# Patient Record
Sex: Female | Born: 1985 | Hispanic: Yes | Marital: Single | Smoking: Never smoker
Health system: Southern US, Community
[De-identification: ages and names within clinical notes are randomized; demographics above are authoritative.]

## PROBLEM LIST (undated history)

## (undated) DIAGNOSIS — O34599 Maternal care for other abnormalities of gravid uterus, unspecified trimester: Secondary | ICD-10-CM

## (undated) DIAGNOSIS — Z789 Other specified health status: Secondary | ICD-10-CM

## (undated) HISTORY — PX: BREAST CYST EXCISION: SHX579

---

## 2018-04-22 LAB — OB RESULTS CONSOLE RUBELLA ANTIBODY, IGM: Rubella: IMMUNE

## 2018-04-22 LAB — OB RESULTS CONSOLE ANTIBODY SCREEN: Antibody Screen: NEGATIVE

## 2018-04-22 LAB — OB RESULTS CONSOLE RPR: RPR: NONREACTIVE

## 2018-04-22 LAB — OB RESULTS CONSOLE ABO/RH: RH Type: POSITIVE

## 2018-04-22 LAB — OB RESULTS CONSOLE GC/CHLAMYDIA
Chlamydia: NEGATIVE
GC PROBE AMP, GENITAL: NEGATIVE

## 2018-04-22 LAB — OB RESULTS CONSOLE HIV ANTIBODY (ROUTINE TESTING): HIV: NONREACTIVE

## 2018-04-22 LAB — OB RESULTS CONSOLE HEPATITIS B SURFACE ANTIGEN: Hepatitis B Surface Ag: NEGATIVE

## 2018-06-10 ENCOUNTER — Encounter (HOSPITAL_COMMUNITY): Payer: Self-pay

## 2018-06-10 ENCOUNTER — Other Ambulatory Visit (HOSPITAL_COMMUNITY): Payer: Self-pay | Admitting: Obstetrics and Gynecology

## 2018-06-10 DIAGNOSIS — O289 Unspecified abnormal findings on antenatal screening of mother: Secondary | ICD-10-CM

## 2018-06-12 ENCOUNTER — Encounter (HOSPITAL_COMMUNITY): Payer: Self-pay | Admitting: *Deleted

## 2018-06-16 ENCOUNTER — Encounter (HOSPITAL_COMMUNITY): Payer: Self-pay

## 2018-06-16 ENCOUNTER — Ambulatory Visit (HOSPITAL_COMMUNITY)
Admission: RE | Admit: 2018-06-16 | Discharge: 2018-06-16 | Disposition: A | Discharge status: Home / Self Care | Payer: BC Managed Care – PPO | Source: Ambulatory Visit | Attending: Obstetrics and Gynecology | Admitting: Obstetrics and Gynecology

## 2018-06-16 ENCOUNTER — Other Ambulatory Visit (HOSPITAL_COMMUNITY): Payer: Self-pay | Admitting: *Deleted

## 2018-06-16 DIAGNOSIS — Z3A18 18 weeks gestation of pregnancy: Secondary | ICD-10-CM

## 2018-06-16 DIAGNOSIS — Q513 Bicornate uterus: Secondary | ICD-10-CM | POA: Insufficient documentation

## 2018-06-16 DIAGNOSIS — O4592 Premature separation of placenta, unspecified, second trimester: Secondary | ICD-10-CM

## 2018-06-16 DIAGNOSIS — O3402 Maternal care for unspecified congenital malformation of uterus, second trimester: Secondary | ICD-10-CM | POA: Insufficient documentation

## 2018-06-16 DIAGNOSIS — Z363 Encounter for antenatal screening for malformations: Secondary | ICD-10-CM | POA: Diagnosis present

## 2018-06-16 DIAGNOSIS — O281 Abnormal biochemical finding on antenatal screening of mother: Secondary | ICD-10-CM | POA: Insufficient documentation

## 2018-06-16 DIAGNOSIS — O208 Other hemorrhage in early pregnancy: Secondary | ICD-10-CM | POA: Insufficient documentation

## 2018-06-16 DIAGNOSIS — O289 Unspecified abnormal findings on antenatal screening of mother: Secondary | ICD-10-CM

## 2018-06-16 DIAGNOSIS — R772 Abnormality of alphafetoprotein: Secondary | ICD-10-CM

## 2018-06-16 HISTORY — DX: Other specified health status: Z78.9

## 2018-06-18 ENCOUNTER — Other Ambulatory Visit: Payer: Self-pay

## 2018-07-14 ENCOUNTER — Ambulatory Visit (HOSPITAL_COMMUNITY)
Admission: RE | Admit: 2018-07-14 | Discharge: 2018-07-14 | Disposition: A | Discharge status: Home / Self Care | Payer: BC Managed Care – PPO | Source: Ambulatory Visit | Attending: Obstetrics and Gynecology | Admitting: Obstetrics and Gynecology

## 2018-07-14 ENCOUNTER — Encounter (HOSPITAL_COMMUNITY): Payer: Self-pay

## 2018-07-14 DIAGNOSIS — O34592 Maternal care for other abnormalities of gravid uterus, second trimester: Secondary | ICD-10-CM

## 2018-07-14 DIAGNOSIS — Z3A22 22 weeks gestation of pregnancy: Secondary | ICD-10-CM | POA: Insufficient documentation

## 2018-07-14 DIAGNOSIS — Q513 Bicornate uterus: Secondary | ICD-10-CM | POA: Diagnosis not present

## 2018-07-14 DIAGNOSIS — Z362 Encounter for other antenatal screening follow-up: Secondary | ICD-10-CM

## 2018-07-14 DIAGNOSIS — O289 Unspecified abnormal findings on antenatal screening of mother: Secondary | ICD-10-CM | POA: Diagnosis present

## 2018-07-14 DIAGNOSIS — R772 Abnormality of alphafetoprotein: Secondary | ICD-10-CM

## 2018-08-14 ENCOUNTER — Other Ambulatory Visit (HOSPITAL_COMMUNITY): Payer: Self-pay | Admitting: Obstetrics and Gynecology

## 2018-08-14 DIAGNOSIS — Z3A28 28 weeks gestation of pregnancy: Secondary | ICD-10-CM

## 2018-08-14 DIAGNOSIS — O283 Abnormal ultrasonic finding on antenatal screening of mother: Secondary | ICD-10-CM

## 2018-08-20 ENCOUNTER — Ambulatory Visit (HOSPITAL_COMMUNITY)
Admission: RE | Admit: 2018-08-20 | Discharge: 2018-08-20 | Disposition: A | Discharge status: Home / Self Care | Payer: BC Managed Care – PPO | Source: Ambulatory Visit | Attending: Obstetrics and Gynecology | Admitting: Obstetrics and Gynecology

## 2018-08-20 ENCOUNTER — Encounter (HOSPITAL_COMMUNITY): Payer: Self-pay

## 2018-08-20 DIAGNOSIS — Z3A28 28 weeks gestation of pregnancy: Secondary | ICD-10-CM | POA: Insufficient documentation

## 2018-08-20 DIAGNOSIS — Z362 Encounter for other antenatal screening follow-up: Secondary | ICD-10-CM | POA: Diagnosis present

## 2018-08-20 DIAGNOSIS — O3403 Maternal care for unspecified congenital malformation of uterus, third trimester: Secondary | ICD-10-CM | POA: Insufficient documentation

## 2018-08-20 DIAGNOSIS — O34592 Maternal care for other abnormalities of gravid uterus, second trimester: Secondary | ICD-10-CM

## 2018-08-20 DIAGNOSIS — Q513 Bicornate uterus: Secondary | ICD-10-CM

## 2018-08-20 DIAGNOSIS — O289 Unspecified abnormal findings on antenatal screening of mother: Secondary | ICD-10-CM | POA: Diagnosis not present

## 2018-08-20 DIAGNOSIS — O283 Abnormal ultrasonic finding on antenatal screening of mother: Secondary | ICD-10-CM

## 2018-09-24 ENCOUNTER — Ambulatory Visit (INDEPENDENT_AMBULATORY_CARE_PROVIDER_SITE_OTHER): Payer: Self-pay | Admitting: Pediatrics

## 2018-09-24 DIAGNOSIS — Z7681 Expectant parent(s) prebirth pediatrician visit: Secondary | ICD-10-CM

## 2018-09-28 NOTE — Progress Notes (Signed)
Prenatal counseling for impending newborn done--1st child, currently 33wks, plan to induce around 37wks as reported hematoma in uterus and IUGR, followd by MFM. Z76.81

## 2018-10-16 ENCOUNTER — Encounter (HOSPITAL_COMMUNITY): Payer: Self-pay

## 2018-10-16 ENCOUNTER — Telehealth (HOSPITAL_COMMUNITY): Payer: Self-pay | Admitting: *Deleted

## 2018-10-16 NOTE — Telephone Encounter (Signed)
Preadmission screen  

## 2018-10-17 ENCOUNTER — Other Ambulatory Visit: Payer: Self-pay | Admitting: Obstetrics and Gynecology

## 2018-10-17 ENCOUNTER — Encounter (HOSPITAL_COMMUNITY): Payer: Self-pay

## 2018-10-21 ENCOUNTER — Encounter (HOSPITAL_COMMUNITY)
Admission: RE | Admit: 2018-10-21 | Discharge: 2018-10-21 | Disposition: A | Discharge status: Home / Self Care | Payer: BC Managed Care – PPO | Source: Ambulatory Visit | Attending: Obstetrics and Gynecology | Admitting: Obstetrics and Gynecology

## 2018-10-21 HISTORY — DX: Maternal care for other abnormalities of gravid uterus, unspecified trimester: O34.599

## 2018-10-21 LAB — CBC
HCT: 33.7 % — ABNORMAL LOW (ref 36.0–46.0)
HEMOGLOBIN: 11.1 g/dL — AB (ref 12.0–15.0)
MCH: 29.4 pg (ref 26.0–34.0)
MCHC: 32.9 g/dL (ref 30.0–36.0)
MCV: 89.2 fL (ref 80.0–100.0)
Platelets: 231 10*3/uL (ref 150–400)
RBC: 3.78 MIL/uL — ABNORMAL LOW (ref 3.87–5.11)
RDW: 13.3 % (ref 11.5–15.5)
WBC: 9.9 10*3/uL (ref 4.0–10.5)
nRBC: 0 % (ref 0.0–0.2)

## 2018-10-21 LAB — TYPE AND SCREEN
ABO/RH(D): O POS
Antibody Screen: NEGATIVE

## 2018-10-21 LAB — ABO/RH: ABO/RH(D): O POS

## 2018-10-21 NOTE — Patient Instructions (Signed)
MARENE TANJI  10/21/2018   Your procedure is scheduled on:  10/22/2018  Enter through the Main Entrance of [facility] at 1230 PM.  Pick up the phone at the desk and dial [phone]  Call this number if you have problems the morning of [phone]  Remember:   Do not eat food:(After Midnight) Desps de medianoche.  Do not drink clear liquids: (After Midnight) Desps de medianoche.  Take these medicines the morning of surgery with A SIP OF WATER: none   Do not wear jewelry, make-up or nail polish.  Do not wear lotions, powders, or perfumes. Do not wear deodorant.  Do not shave 48 hours prior to surgery.  Do not bring valuables to the hospital.  [facility] is not   responsible for any belongings or valuables brought to the hospital.  Contacts, dentures or bridgework may not be worn into surgery.  Leave suitcase in the car. After surgery it may be brought to your room.  For patients admitted to the hospital, checkout time is 11:00 AM the day of              discharge.    N/A   Please read over the following fact sheets that you were given:   Surgical Site Infection Prevention

## 2018-10-21 NOTE — H&P (Signed)
Maria Peterson is a 33 y.o. female presenting for primary csection due to IUGR, intraplacental hematoma and umbilical varix at 37 wks.  OB History    Gravida  3   Para      Term      Preterm      AB  2   Living  0     SAB  1   TAB  1   Ectopic      Multiple      Live Births             Past Medical History:  Diagnosis Date  . Medical history non-contributory   . Uterine abnormality in pregnancy    Past Surgical History:  Procedure Laterality Date  . BREAST CYST EXCISION     Family History: family history includes Heart disease in her paternal grandmother and paternal uncle; Ovarian cancer in her maternal grandmother. Social History:  reports that she has never smoked. She has never used smokeless tobacco. She reports previous alcohol use. She reports that she does not use drugs.     Maternal Diabetes: No Genetic Screening: Normal Maternal Ultrasounds/Referrals: Abnormal:  Findings:   Other: Fetal Ultrasounds or other Referrals:  Referred to Materal Fetal Medicine - intraplacental hematoma, unexplained inc MSAFP Maternal Substance Abuse:  No Significant Maternal Medications:  None Significant Maternal Lab Results:  None Other Comments:  None  Review of Systems  Constitutional: Negative.   All other systems reviewed and are negative.  Maternal Medical History:  Fetal activity: Perceived fetal activity is normal.   Last perceived fetal movement was within the past hour.    Prenatal complications: Placental abnormality.   Prenatal Complications - Diabetes: none.      Last menstrual period 02/05/2018. Maternal Exam:  Uterine Assessment: Contraction strength is mild.  Contraction frequency is irregular.   Abdomen: Patient reports no abdominal tenderness. Fetal presentation: vertex  Introitus: Normal vulva. Normal vagina.  Ferning test: negative.  Nitrazine test: negative. Amniotic fluid character: not assessed.  Pelvis: questionable for  delivery.   Cervix: Cervix evaluated by digital exam.     Physical Exam  Nursing note and vitals reviewed. Constitutional: She is oriented to person, place, and time. She appears well-developed and well-nourished.  HENT:  Head: Normocephalic and atraumatic.  Neck: Normal range of motion. Neck supple.  Cardiovascular: Normal rate and regular rhythm.  Respiratory: Effort normal and breath sounds normal.  GI: Soft. Bowel sounds are normal.  Genitourinary:    Vulva, vagina and uterus normal.   Musculoskeletal: Normal range of motion.  Neurological: She is alert and oriented to person, place, and time. She has normal reflexes.  Skin: Skin is warm and dry.  Psychiatric: She has a normal mood and affect.    Prenatal labs: ABO, Rh: --/--/O POS, O POS Performed at [facility], [address] [place], [place] [address]  [phone]) Antibody: NEG (01/14 1032) Rubella: Immune (07/16 0000) RPR: Nonreactive (07/16 0000)  HBsAg: Negative (07/16 0000)  HIV: Non-reactive (07/16 0000)  GBS:     Assessment/Plan: 37 week IUP Intraplacental hematoma Umbilical varix IUGR Primary csection - risks vs benefits of attempted VD vs Csection discussed.    Heli Dino J 10/21/2018, 9:08 PM

## 2018-10-22 ENCOUNTER — Encounter (HOSPITAL_COMMUNITY): Payer: Self-pay | Admitting: *Deleted

## 2018-10-22 ENCOUNTER — Inpatient Hospital Stay (HOSPITAL_COMMUNITY)
Admission: AD | Admit: 2018-10-22 | Discharge: 2018-10-25 | DRG: 786 | Disposition: A | Discharge status: Home / Self Care | Payer: BC Managed Care – PPO | Attending: Obstetrics and Gynecology | Admitting: Obstetrics and Gynecology

## 2018-10-22 ENCOUNTER — Encounter (HOSPITAL_COMMUNITY)
Admission: AD | Disposition: A | Discharge status: Home / Self Care | Payer: Self-pay | Source: Home / Self Care | Attending: Obstetrics and Gynecology

## 2018-10-22 ENCOUNTER — Inpatient Hospital Stay (HOSPITAL_COMMUNITY): Payer: BC Managed Care – PPO

## 2018-10-22 DIAGNOSIS — Q513 Bicornate uterus: Secondary | ICD-10-CM | POA: Diagnosis not present

## 2018-10-22 DIAGNOSIS — O365930 Maternal care for other known or suspected poor fetal growth, third trimester, not applicable or unspecified: Secondary | ICD-10-CM | POA: Diagnosis present

## 2018-10-22 DIAGNOSIS — O4590 Premature separation of placenta, unspecified, unspecified trimester: Secondary | ICD-10-CM | POA: Diagnosis present

## 2018-10-22 DIAGNOSIS — O99214 Obesity complicating childbirth: Secondary | ICD-10-CM | POA: Diagnosis present

## 2018-10-22 DIAGNOSIS — Z3A37 37 weeks gestation of pregnancy: Secondary | ICD-10-CM | POA: Diagnosis not present

## 2018-10-22 DIAGNOSIS — D649 Anemia, unspecified: Secondary | ICD-10-CM | POA: Diagnosis present

## 2018-10-22 DIAGNOSIS — O9902 Anemia complicating childbirth: Secondary | ICD-10-CM | POA: Diagnosis present

## 2018-10-22 DIAGNOSIS — O3403 Maternal care for unspecified congenital malformation of uterus, third trimester: Secondary | ICD-10-CM | POA: Diagnosis present

## 2018-10-22 DIAGNOSIS — Z98891 History of uterine scar from previous surgery: Secondary | ICD-10-CM

## 2018-10-22 DIAGNOSIS — O4593 Premature separation of placenta, unspecified, third trimester: Secondary | ICD-10-CM | POA: Diagnosis present

## 2018-10-22 LAB — RPR: RPR: NONREACTIVE

## 2018-10-22 SURGERY — Surgical Case
Anesthesia: Spinal

## 2018-10-22 MED ORDER — DIPHENHYDRAMINE HCL 25 MG PO CAPS: 25.0000 mg | ORAL_CAPSULE | Freq: Four times a day (QID) | ORAL | Status: DC | PRN

## 2018-10-22 MED ORDER — DEXAMETHASONE SODIUM PHOSPHATE 10 MG/ML IJ SOLN
INTRAMUSCULAR | Status: AC
  Filled 2018-10-22: qty 1

## 2018-10-22 MED ORDER — METHYLERGONOVINE MALEATE 0.2 MG/ML IJ SOLN: 0.2000 mg | INTRAMUSCULAR | Status: DC | PRN

## 2018-10-22 MED ORDER — SIMETHICONE 80 MG PO CHEW: 80.0000 mg | CHEWABLE_TABLET | ORAL | Status: DC | PRN

## 2018-10-22 MED ORDER — NALOXONE HCL 0.4 MG/ML IJ SOLN: 0.4000 mg | INTRAMUSCULAR | Status: DC | PRN

## 2018-10-22 MED ORDER — MORPHINE SULFATE (PF) 0.5 MG/ML IJ SOLN
INTRAMUSCULAR | Status: AC
  Filled 2018-10-22: qty 10

## 2018-10-22 MED ORDER — FENTANYL CITRATE (PF) 100 MCG/2ML IJ SOLN
INTRAMUSCULAR | Status: AC
  Filled 2018-10-22: qty 2

## 2018-10-22 MED ORDER — SCOPOLAMINE 1 MG/3DAYS TD PT72
1.0000 | MEDICATED_PATCH | TRANSDERMAL | Status: DC
  Administered 2018-10-22: 1.5 mg via TRANSDERMAL

## 2018-10-22 MED ORDER — KETOROLAC TROMETHAMINE 30 MG/ML IJ SOLN: 30.0000 mg | Freq: Four times a day (QID) | INTRAMUSCULAR | Status: AC | PRN

## 2018-10-22 MED ORDER — BUPIVACAINE HCL (PF) 0.25 % IJ SOLN
INTRAMUSCULAR | Status: DC | PRN
  Administered 2018-10-22: 20 mL

## 2018-10-22 MED ORDER — SOD CITRATE-CITRIC ACID 500-334 MG/5ML PO SOLN
ORAL | Status: AC
  Filled 2018-10-22: qty 15

## 2018-10-22 MED ORDER — METHYLERGONOVINE MALEATE 0.2 MG PO TABS: 0.2000 mg | ORAL_TABLET | ORAL | Status: DC | PRN

## 2018-10-22 MED ORDER — SIMETHICONE 80 MG PO CHEW
80.0000 mg | CHEWABLE_TABLET | Freq: Three times a day (TID) | ORAL | Status: DC
  Administered 2018-10-22 – 2018-10-25 (×8): 80 mg via ORAL
  Filled 2018-10-22 (×8): qty 1

## 2018-10-22 MED ORDER — LACTATED RINGERS IV SOLN
INTRAVENOUS | Status: DC
  Administered 2018-10-22 (×2): via INTRAVENOUS

## 2018-10-22 MED ORDER — LACTATED RINGERS IV SOLN
INTRAVENOUS | Status: DC
  Administered 2018-10-23: 06:00:00 via INTRAVENOUS

## 2018-10-22 MED ORDER — ZOLPIDEM TARTRATE 5 MG PO TABS: 5.0000 mg | ORAL_TABLET | Freq: Every evening | ORAL | Status: DC | PRN

## 2018-10-22 MED ORDER — ONDANSETRON HCL 4 MG/2ML IJ SOLN: 4.0000 mg | Freq: Three times a day (TID) | INTRAMUSCULAR | Status: DC | PRN

## 2018-10-22 MED ORDER — SODIUM CHLORIDE 0.9 % IR SOLN
Status: DC | PRN
  Administered 2018-10-22: 1000 mL

## 2018-10-22 MED ORDER — TETANUS-DIPHTH-ACELL PERTUSSIS 5-2.5-18.5 LF-MCG/0.5 IM SUSP: 0.5000 mL | Freq: Once | INTRAMUSCULAR | Status: DC

## 2018-10-22 MED ORDER — OXYCODONE HCL 5 MG PO TABS: 5.0000 mg | ORAL_TABLET | ORAL | Status: DC | PRN

## 2018-10-22 MED ORDER — PHENYLEPHRINE 8 MG IN D5W 100 ML (0.08MG/ML) PREMIX OPTIME
INJECTION | INTRAVENOUS | Status: AC
  Filled 2018-10-22: qty 100

## 2018-10-22 MED ORDER — LACTATED RINGERS IV SOLN
INTRAVENOUS | Status: DC | PRN
  Administered 2018-10-22: 15:00:00 via INTRAVENOUS

## 2018-10-22 MED ORDER — OXYTOCIN 10 UNIT/ML IJ SOLN
INTRAVENOUS | Status: DC | PRN
  Administered 2018-10-22: 40 [IU] via INTRAVENOUS

## 2018-10-22 MED ORDER — BUPIVACAINE HCL (PF) 0.25 % IJ SOLN
INTRAMUSCULAR | Status: AC
  Filled 2018-10-22: qty 20

## 2018-10-22 MED ORDER — FENTANYL CITRATE (PF) 100 MCG/2ML IJ SOLN
INTRAMUSCULAR | Status: DC | PRN
  Administered 2018-10-22: 15 ug via INTRATHECAL

## 2018-10-22 MED ORDER — OXYTOCIN 10 UNIT/ML IJ SOLN
INTRAMUSCULAR | Status: AC
  Filled 2018-10-22: qty 4

## 2018-10-22 MED ORDER — SOD CITRATE-CITRIC ACID 500-334 MG/5ML PO SOLN
30.0000 mL | Freq: Once | ORAL | Status: AC
  Administered 2018-10-22: 30 mL via ORAL

## 2018-10-22 MED ORDER — FENTANYL CITRATE (PF) 100 MCG/2ML IJ SOLN: 25.0000 ug | INTRAMUSCULAR | Status: DC | PRN

## 2018-10-22 MED ORDER — DIBUCAINE 1 % RE OINT: 1.0000 "application " | TOPICAL_OINTMENT | RECTAL | Status: DC | PRN

## 2018-10-22 MED ORDER — OXYTOCIN 40 UNITS IN NORMAL SALINE INFUSION - SIMPLE MED: 2.5000 [IU]/h | INTRAVENOUS | Status: AC

## 2018-10-22 MED ORDER — PRENATAL MULTIVITAMIN CH
1.0000 | ORAL_TABLET | Freq: Every day | ORAL | Status: DC
  Administered 2018-10-23 – 2018-10-25 (×3): 1 via ORAL
  Filled 2018-10-22 (×3): qty 1

## 2018-10-22 MED ORDER — SENNOSIDES-DOCUSATE SODIUM 8.6-50 MG PO TABS
2.0000 | ORAL_TABLET | ORAL | Status: DC
  Administered 2018-10-23 – 2018-10-25 (×3): 2 via ORAL
  Filled 2018-10-22 (×3): qty 2

## 2018-10-22 MED ORDER — KETOROLAC TROMETHAMINE 30 MG/ML IJ SOLN
30.0000 mg | Freq: Four times a day (QID) | INTRAMUSCULAR | Status: AC | PRN
  Administered 2018-10-23: 30 mg via INTRAVENOUS
  Filled 2018-10-22: qty 1

## 2018-10-22 MED ORDER — ONDANSETRON HCL 4 MG/2ML IJ SOLN
INTRAMUSCULAR | Status: AC
  Filled 2018-10-22: qty 2

## 2018-10-22 MED ORDER — WITCH HAZEL-GLYCERIN EX PADS: 1.0000 "application " | MEDICATED_PAD | CUTANEOUS | Status: DC | PRN

## 2018-10-22 MED ORDER — COCONUT OIL OIL
1.0000 "application " | TOPICAL_OIL | Status: DC | PRN
  Filled 2018-10-22: qty 120

## 2018-10-22 MED ORDER — DEXAMETHASONE SODIUM PHOSPHATE 10 MG/ML IJ SOLN
INTRAMUSCULAR | Status: DC | PRN
  Administered 2018-10-22: 10 mg via INTRAVENOUS

## 2018-10-22 MED ORDER — PHENYLEPHRINE 8 MG IN D5W 100 ML (0.08MG/ML) PREMIX OPTIME
INJECTION | INTRAVENOUS | Status: DC | PRN
  Administered 2018-10-22: 60 ug/min via INTRAVENOUS

## 2018-10-22 MED ORDER — SODIUM CHLORIDE 0.9% FLUSH: 3.0000 mL | INTRAVENOUS | Status: DC | PRN

## 2018-10-22 MED ORDER — CEFAZOLIN SODIUM-DEXTROSE 2-4 GM/100ML-% IV SOLN
2.0000 g | INTRAVENOUS | Status: AC
  Administered 2018-10-22: 2 g via INTRAVENOUS

## 2018-10-22 MED ORDER — SCOPOLAMINE 1 MG/3DAYS TD PT72
MEDICATED_PATCH | TRANSDERMAL | Status: AC
  Filled 2018-10-22: qty 1

## 2018-10-22 MED ORDER — MEPERIDINE HCL 25 MG/ML IJ SOLN: 6.2500 mg | INTRAMUSCULAR | Status: DC | PRN

## 2018-10-22 MED ORDER — ONDANSETRON HCL 4 MG/2ML IJ SOLN
INTRAMUSCULAR | Status: DC | PRN
  Administered 2018-10-22: 4 mg via INTRAVENOUS

## 2018-10-22 MED ORDER — BUPIVACAINE IN DEXTROSE 0.75-8.25 % IT SOLN
INTRATHECAL | Status: DC | PRN
  Administered 2018-10-22: 1.7 mL via INTRATHECAL

## 2018-10-22 MED ORDER — MENTHOL 3 MG MT LOZG: 1.0000 | LOZENGE | OROMUCOSAL | Status: DC | PRN

## 2018-10-22 MED ORDER — MORPHINE SULFATE (PF) 0.5 MG/ML IJ SOLN
INTRAMUSCULAR | Status: DC | PRN
  Administered 2018-10-22: .15 mg via EPIDURAL

## 2018-10-22 MED ORDER — SIMETHICONE 80 MG PO CHEW
80.0000 mg | CHEWABLE_TABLET | ORAL | Status: DC
  Administered 2018-10-23 – 2018-10-25 (×3): 80 mg via ORAL
  Filled 2018-10-22 (×3): qty 1

## 2018-10-22 SURGICAL SUPPLY — 40 items
BENZOIN TINCTURE PRP APPL 2/3 (GAUZE/BANDAGES/DRESSINGS) ×2 IMPLANT
CHLORAPREP W/TINT 26ML (MISCELLANEOUS) ×3 IMPLANT
CLAMP CORD UMBIL (MISCELLANEOUS) IMPLANT
CLOSURE WOUND 1/2 X4 (GAUZE/BANDAGES/DRESSINGS) ×1
CLOTH BEACON ORANGE TIMEOUT ST (SAFETY) ×3 IMPLANT
DERMABOND ADHESIVE PROPEN (GAUZE/BANDAGES/DRESSINGS) ×2
DERMABOND ADVANCED .7 DNX6 (GAUZE/BANDAGES/DRESSINGS) IMPLANT
DRSG OPSITE POSTOP 4X10 (GAUZE/BANDAGES/DRESSINGS) ×3 IMPLANT
ELECT REM PT RETURN 9FT ADLT (ELECTROSURGICAL) ×3
ELECTRODE REM PT RTRN 9FT ADLT (ELECTROSURGICAL) ×1 IMPLANT
EXTRACTOR VACUUM M CUP 4 TUBE (SUCTIONS) IMPLANT
EXTRACTOR VACUUM M CUP 4' TUBE (SUCTIONS)
GLOVE BIO SURGEON STRL SZ7.5 (GLOVE) ×3 IMPLANT
GLOVE BIOGEL PI IND STRL 7.0 (GLOVE) ×1 IMPLANT
GLOVE BIOGEL PI INDICATOR 7.0 (GLOVE) ×2
GOWN STRL REUS W/TWL LRG LVL3 (GOWN DISPOSABLE) ×6 IMPLANT
KIT ABG SYR 3ML LUER SLIP (SYRINGE) IMPLANT
NDL HYPO 25X5/8 SAFETYGLIDE (NEEDLE) IMPLANT
NDL SPNL 20GX3.5 QUINCKE YW (NEEDLE) IMPLANT
NEEDLE HYPO 22GX1.5 SAFETY (NEEDLE) ×3 IMPLANT
NEEDLE HYPO 25X5/8 SAFETYGLIDE (NEEDLE) IMPLANT
NEEDLE SPNL 20GX3.5 QUINCKE YW (NEEDLE) IMPLANT
NS IRRIG 1000ML POUR BTL (IV SOLUTION) ×3 IMPLANT
PACK C SECTION WH (CUSTOM PROCEDURE TRAY) ×3 IMPLANT
PENCIL SMOKE EVAC W/HOLSTER (ELECTROSURGICAL) ×3 IMPLANT
SPONGE LAP 18X18 RF (DISPOSABLE) ×9 IMPLANT
STRIP CLOSURE SKIN 1/2X4 (GAUZE/BANDAGES/DRESSINGS) ×1 IMPLANT
SUT MNCRL 0 VIOLET CTX 36 (SUTURE) ×2 IMPLANT
SUT MNCRL AB 3-0 PS2 27 (SUTURE) IMPLANT
SUT MON AB 2-0 CT1 27 (SUTURE) ×3 IMPLANT
SUT MON AB-0 CT1 36 (SUTURE) ×6 IMPLANT
SUT MONOCRYL 0 CTX 36 (SUTURE) ×4
SUT PLAIN 0 NONE (SUTURE) IMPLANT
SUT PLAIN 2 0 (SUTURE)
SUT PLAIN 2 0 XLH (SUTURE) ×2 IMPLANT
SUT PLAIN ABS 2-0 CT1 27XMFL (SUTURE) IMPLANT
SYR 20CC LL (SYRINGE) IMPLANT
SYR CONTROL 10ML LL (SYRINGE) ×3 IMPLANT
TOWEL OR 17X24 6PK STRL BLUE (TOWEL DISPOSABLE) ×3 IMPLANT
TRAY FOLEY W/BAG SLVR 14FR LF (SET/KITS/TRAYS/PACK) ×3 IMPLANT

## 2018-10-22 NOTE — Anesthesia Preprocedure Evaluation (Signed)
Anesthesia Evaluation  Patient identified by MRN, date of birth, ID band Patient awake    Reviewed: Allergy & Precautions, NPO status , Patient's Chart, lab work & pertinent test results  Airway Mallampati: II  TM Distance: >3 FB Neck ROM: Full    Dental no notable dental hx. (+) Teeth Intact   Pulmonary neg pulmonary ROS,    Pulmonary exam normal breath sounds clear to auscultation       Cardiovascular negative cardio ROS Normal cardiovascular exam Rhythm:Regular Rate:Normal     Neuro/Psych negative neurological ROS  negative psych ROS   GI/Hepatic Neg liver ROS, GERD  ,  Endo/Other  Obesity  Renal/GU negative Renal ROS  negative genitourinary   Musculoskeletal negative musculoskeletal ROS (+)   Abdominal (+) + obese,   Peds  Hematology  (+) anemia ,   Anesthesia Other Findings   Reproductive/Obstetrics (+) Pregnancy Placental abruption                             Anesthesia Physical Anesthesia Plan  ASA: II  Anesthesia Plan: Spinal   Post-op Pain Management:    Induction:   PONV Risk Score and Plan: 4 or greater and Scopolamine patch - Pre-op, Midazolam, Ondansetron and Dexamethasone  Airway Management Planned: Natural Airway  Additional Equipment:   Intra-op Plan:   Post-operative Plan:   Informed Consent: I have reviewed the patients History and Physical, chart, labs and discussed the procedure including the risks, benefits and alternatives for the proposed anesthesia with the patient or authorized representative who has indicated his/her understanding and acceptance.     Dental advisory given  Plan Discussed with: CRNA and Surgeon  Anesthesia Plan Comments:         Anesthesia Quick Evaluation

## 2018-10-22 NOTE — Transfer of Care (Signed)
Immediate Anesthesia Transfer of Care Note  Patient: Maria Peterson  Procedure(s) Performed: Primary CESAREAN SECTION (N/A )  Patient Location: PACU  Anesthesia Type:Spinal  Level of Consciousness: awake, alert  and oriented  Airway & Oxygen Therapy: Patient Spontanous Breathing  Post-op Assessment: Report given to RN and Post -op Vital signs reviewed and stable  Post vital signs: Reviewed and stable  Last Vitals:  Vitals Value Taken Time  BP 104/88 10/22/2018  3:30 PM  Temp    Pulse 75 10/22/2018  3:32 PM  Resp 17 10/22/2018  3:32 PM  SpO2 100 % 10/22/2018  3:32 PM  Vitals shown include unvalidated device data.  Last Pain:  Vitals:   10/22/18 1318  TempSrc: Oral         Complications: No apparent anesthesia complications

## 2018-10-22 NOTE — Op Note (Signed)
Cesarean Section Procedure Note  Indications: Intraplacental hematoma  Pre-operative Diagnosis: 37 week 0 day pregnancy.  Post-operative Diagnosis: same, Bicornuate uterus  Surgeon: Lenoard Aden   Assistants: Sigmon, CNM  Anesthesia: Local anesthesia 0.25.% bupivacaine and Spinal anesthesia  ASA Class: 2  Procedure Details  The patient was seen in the Holding Room. The risks, benefits, complications, treatment options, and expected outcomes were discussed with the patient.  The patient concurred with the proposed plan, giving informed consent. The risks of anesthesia, infection, bleeding and possible injury to other organs discussed. Injury to bowel, bladder, or ureter with possible need for repair discussed. Possible need for transfusion with secondary risks of hepatitis or HIV acquisition discussed. Post operative complications to include but not limited to DVT, PE and Pneumonia noted. The site of surgery properly noted/marked. The patient was taken to Operating Room # 1, identified as GOWRI GOODIN and the procedure verified as C-Section Delivery. A Time Out was held and the above information confirmed.  After induction of anesthesia, the patient was draped and prepped in the usual sterile manner. A Pfannenstiel incision was made and carried down through the subcutaneous tissue to the fascia. Fascial incision was made and extended transversely using Mayo scissors. The fascia was separated from the underlying rectus tissue superiorly and inferiorly. The peritoneum was identified and entered. Peritoneal incision was extended longitudinally. The utero-vesical peritoneal reflection was incised transversely and the bladder flap was bluntly freed from the lower uterine segment. A low transverse uterine incision(Kerr hysterotomy) was made. Delivered from OA presentation was a  female with Apgar scores of 8 at one minute and 9 at five minutes. Bulb suctioning gently performed. Neonatal team in  attendance.After the umbilical cord was clamped and cut cord blood was obtained for evaluation. The placenta was removed intact and appeared abnormal enlarged with large hematoma. The uterus was curetted with a dry lap pack. Good hemostasis was noted.The uterine outline, tubes and ovaries appeared abnormal with bicornuate uterus. The uterine incision was closed with running locked sutures of 0 Monocryl x 2 layers. Hemostasis was observed. The parietal peritoneum was closed with a running 2-0 Monocryl suture. The fascia was then reapproximated with running sutures of 0 Monocryl. The skin was reapproximated with 3-0 monocryl after [place] closure with 2-0 plain.  Instrument, sponge, and needle counts were correct prior the abdominal closure and at the conclusion of the case.   Findings: Abnormal placenta  Estimated Blood Loss:  1000         Drains: foley                 Specimens: placenta                 Complications:  None; patient tolerated the procedure well.         Disposition: PACU - hemodynamically stable.         Condition: stable  Attending Attestation: I performed the procedure.

## 2018-10-22 NOTE — Anesthesia Postprocedure Evaluation (Signed)
Anesthesia Post Note  Patient: Maria Peterson  Procedure(s) Performed: Primary CESAREAN SECTION (N/A )     Patient location during evaluation: PACU Anesthesia Type: Spinal Level of consciousness: awake and alert and oriented Pain management: pain level controlled Vital Signs Assessment: post-procedure vital signs reviewed and stable Respiratory status: spontaneous breathing and nonlabored ventilation Cardiovascular status: blood pressure returned to baseline and stable Postop Assessment: no headache, no backache, spinal receding, patient able to bend at knees and no apparent nausea or vomiting Anesthetic complications: no    Last Vitals:  Vitals:   10/22/18 1630 10/22/18 1638  BP:  113/65  Pulse: 76 66  Resp: 16 18  Temp:  36.7 C  SpO2: 100% 100%    Last Pain:  Vitals:   10/22/18 1638  TempSrc: Oral  PainSc:    Pain Goal:    LLE Motor Response: Purposeful movement (10/22/18 1630) LLE Sensation: Tingling (10/22/18 1630) RLE Motor Response: Purposeful movement (10/22/18 1630) RLE Sensation: Tingling (10/22/18 1630)     @ANFLOW60MIN (12500)  )Shereta Crothers A.

## 2018-10-22 NOTE — Progress Notes (Signed)
Patient ID: Maria Peterson, female   DOB: 1985-12-14, 33 y.o.   MRN: [id number] Patient seen and examined. Consent witnessed and signed. No changes noted. Update completed. BP 126/76   Pulse 90   Temp 98.1 F (36.7 C) (Oral)   Resp 18   Ht 5\' 7"  (1.702 m)   Wt 88.5 kg   LMP 02/05/2018   SpO2 100%   BMI 30.54 kg/m   CBC    Component Value Date/Time   WBC 9.9 10/21/2018 1032   RBC 3.78 (L) 10/21/2018 1032   HGB 11.1 (L) 10/21/2018 1032   HCT 33.7 (L) 10/21/2018 1032   PLT 231 10/21/2018 1032   MCV 89.2 10/21/2018 1032   MCH 29.4 10/21/2018 1032   MCHC 32.9 10/21/2018 1032   RDW 13.3 10/21/2018 1032

## 2018-10-22 NOTE — Anesthesia Procedure Notes (Signed)
Spinal  Patient location during procedure: OR Start time: 10/22/2018 2:27 PM End time: 10/22/2018 2:30 PM Staffing Anesthesiologist: Mal Amabile, MD Performed: anesthesiologist  Preanesthetic Checklist Completed: patient identified, site marked, surgical consent, pre-op evaluation, timeout performed, IV checked, risks and benefits discussed and monitors and equipment checked Spinal Block Patient position: sitting Prep: site prepped and draped and DuraPrep Patient monitoring: heart rate, cardiac monitor, continuous pulse ox and blood pressure Approach: midline Location: L3-4 Injection technique: single-shot Needle Needle type: Pencan  Needle gauge: 24 G Needle length: 9 cm Needle insertion depth: 7 cm Assessment Sensory level: T4 Additional Notes Patient tolerated procedure well. Adequate sensory level.

## 2018-10-23 ENCOUNTER — Encounter (HOSPITAL_COMMUNITY): Payer: Self-pay | Admitting: Obstetrics and Gynecology

## 2018-10-23 LAB — CBC
HEMATOCRIT: 25.4 % — AB (ref 36.0–46.0)
HEMOGLOBIN: 8.7 g/dL — AB (ref 12.0–15.0)
MCH: 30.2 pg (ref 26.0–34.0)
MCHC: 34.3 g/dL (ref 30.0–36.0)
MCV: 88.2 fL (ref 80.0–100.0)
Platelets: 207 10*3/uL (ref 150–400)
RBC: 2.88 MIL/uL — ABNORMAL LOW (ref 3.87–5.11)
RDW: 13.1 % (ref 11.5–15.5)
WBC: 16.8 10*3/uL — ABNORMAL HIGH (ref 4.0–10.5)
nRBC: 0 % (ref 0.0–0.2)

## 2018-10-23 MED ORDER — ACETAMINOPHEN 500 MG PO TABS
1000.0000 mg | ORAL_TABLET | Freq: Four times a day (QID) | ORAL | Status: DC | PRN
  Filled 2018-10-23: qty 2

## 2018-10-23 MED ORDER — IBUPROFEN 600 MG PO TABS
600.0000 mg | ORAL_TABLET | Freq: Three times a day (TID) | ORAL | Status: DC
  Administered 2018-10-23 – 2018-10-25 (×7): 600 mg via ORAL
  Filled 2018-10-23 (×7): qty 1

## 2018-10-23 MED ORDER — SODIUM CHLORIDE 0.9 % IV SOLN
510.0000 mg | Freq: Once | INTRAVENOUS | Status: AC
  Administered 2018-10-23: 510 mg via INTRAVENOUS
  Filled 2018-10-23: qty 17

## 2018-10-23 NOTE — Addendum Note (Signed)
Addendum  created 10/23/18 0900 by Cleda Clarks, CRNA   Clinical Note Signed

## 2018-10-23 NOTE — Lactation Note (Addendum)
This note was copied from a baby's chart. Lactation Consultation Note  Patient Name: Maria Peterson Date: 10/23/2018 Reason for consult: Initial assessment;1st time breastfeeding;Primapara;Infant < 6lbs;Early term 28-38.6wks  9 hours old early term female < 6 lbs who is now being partially BF and formula fed by his mother, she's a P1. Baby had to be supplemented with Similac 22 calorie formula earlier today due to low glucose. Mom had already started pumping, her RN Maria Peterson has been very proactive and set her up with a DEBP. She also helped her get holes into her bra for a "custom pumping bra fit"; even though mom said it was to help evert her nipples. Explained to mom that holes are not even aligned and that the best use for her bra would be for "hands free" pumping. Mom has flat nipples, LC set her up with shells, instructions, cleaning and storage were reviewed. She also has a Medela DEBP at home.  Mom is familiar with hand expression and also getting volume through pumping, she got at least 5-6 ml in her last pumping session. Reviewed LPI guidelines with mom and she understands that as long as EBM volumes required for supplementation according to baby's age in hours are met, baby won't need formula. If pumping volumes are below requirements, Similac 22 will be used to complete those volumes. Mom also aware that she'll be using her own EBM first prior using formula. She also understands that baby is just "practicing" at the breast at this point and that his main source of nutrition will be through her EMB and Similac 22.  Discussed cluster feeding, feeding cues, and normal newborn behavior. Parents engaged during [facility] consultation and had lots of questions. Baby was asleep when entering the room and not ready to feed. Asked mom to call for assistance when needed.  Feeding plan:  1. Encouraged parents to feed baby STS every 3 hours or sooner if feeding cues are present, preferably at the  breast 2. Mom will continue pumping every 3 hours and at least once at night 3. Parents will continue supplementing every 3 hours any amount of EBM available and will only use Similac 22 calorie formula to complete volumes 4. Mom will wear her breastshells starting tomorrow morning, daytime only  BF brochure, BF resources and feeding diary were reviewed. Parents reported all questions and concerns were answered, they're both aware of [facility] services and will call PRN.    Maternal Data Formula Feeding for Exclusion: No Has patient been taught Hand Expression?: Yes Does the patient have breastfeeding experience prior to this delivery?: No  Feeding Feeding Type: Breast Fed  LATCH Score Latch: Too sleepy or reluctant, no latch achieved, no sucking elicited.  Audible Swallowing: None  Type of Nipple: Flat  Comfort (Breast/Nipple): Soft / non-tender  Hold (Positioning): Full assist, staff holds infant at breast  LATCH Score: 3  Interventions Interventions: Breast feeding basics reviewed;Breast compression;Shells;DEBP;Reverse pressure  Lactation Tools Discussed/Used Tools: Shells;Pump Shell Type: Inverted Breast pump type: Double-Electric Breast Pump WIC Program: No Pump Review: Setup, frequency, and cleaning Initiated by:: RN Date initiated:: 10/22/18   Consult Status Consult Status: Follow-up Date: 10/23/18 Follow-up type: In-patient    Maria Peterson 10/23/2018, 12:14 AM

## 2018-10-23 NOTE — Anesthesia Postprocedure Evaluation (Signed)
Anesthesia Post Note  Patient: Maria Peterson  Procedure(s) Performed: Primary CESAREAN SECTION (N/A )     Patient location during evaluation: Mother Baby Anesthesia Type: Spinal Level of consciousness: awake, awake and alert and oriented Pain management: pain level controlled Vital Signs Assessment: post-procedure vital signs reviewed and stable Respiratory status: spontaneous breathing and respiratory function stable Cardiovascular status: blood pressure returned to baseline Postop Assessment: no headache, no backache, spinal receding, patient able to bend at knees, no apparent nausea or vomiting, adequate PO intake and able to ambulate Anesthetic complications: no    Last Vitals:  Vitals:   10/23/18 0121 10/23/18 0530  BP: 105/62 (!) 104/56  Pulse: 61 (!) 56  Resp: 17 16  Temp: 37.2 C 37 C  SpO2: 99% 100%    Last Pain:  Vitals:   10/23/18 0720  TempSrc:   PainSc: Asleep   Pain Goal:                @ANFLOW60MIN (12500)  )Cleda Clarks

## 2018-10-23 NOTE — Progress Notes (Signed)
Post Partum Day 1 POD #1  Subjective: no complaints, up ad lib, voiding, tolerating PO and + flatus  Objective: Blood pressure (!) 104/56, pulse (!) 56, temperature 98.6 F (37 C), resp. rate 16, height 5\' 7"  (1.702 m), weight 88.5 kg, last menstrual period 02/05/2018, SpO2 100 %.  Physical Exam:  General: alert, cooperative and appears stated age Lochia: appropriate Uterine Fundus: firm Incision: healing well, no significant drainage DVT Evaluation: No evidence of DVT seen on physical exam. No cords or calf tenderness.  Recent Labs    10/21/18 1032 10/23/18 0500  HGB 11.1* 8.7*  HCT 33.7* 25.4*    Assessment/Plan:j POD 1 s/p Csection Anemia Fe transfusion Breastfeeding, Lactation consult, Social Work consult and Circumcision prior to discharge   LOS: 1 day   Dore Oquin J 10/23/2018, 8:51 AM

## 2018-10-23 NOTE — Lactation Note (Signed)
This note was copied from a baby's chart. Lactation Consultation Note  Patient Name: Maria Peterson SWNIO'E Date: 10/23/2018   P1, Baby 23 hours old and circ today.  37 weeks < 6 lbs. Unwrapped baby for feeding. Baby very sleepy and hasn't been breastfeeding. Mother pumped 5 ml.  Demonstrated how to feed baby on side via slow flow nipple and baby took 4 ml. Gave the additional 1 ml with syringe and finger. Encouraged mother to pump 3 hours using hands on pumping and give additional volume per volume guidelines with formula.  I Feed on demand approximately 8-12 times per day at least q 3 hours. Reviewed cleaning and milk storage.        Maternal Data    Feeding Feeding Type: Bottle Fed - Breast Milk(attempted but too sleepy )  LATCH Score                   Interventions    Lactation Tools Discussed/Used     Consult Status      [name] 10/23/2018, 2:00 PM

## 2018-10-24 NOTE — Progress Notes (Signed)
POD#2 status post primary cesarean section for bicornuate uterus, IUGR and umbilical cord hematoma.  S: Pt notes dizziness and shakiness when standing.  Headache when standing.  No symptoms at rest.  Patient is status post IV iron yesterday.  Pain controlled w/ po meds, minimal lochia, nl void.no chest pain pain, tol reg po.  Minimal lochia  Vitals:   10/23/18 1400 10/23/18 2236 10/23/18 2247 10/24/18 0613  BP: 107/73 126/71 125/84 115/74  Pulse: 60 77 76 75  Resp: 16 16  16   Temp: 98.5 F (36.9 C) 98.9 F (37.2 C)    TempSrc: Oral Oral    SpO2: 98% 99% 99%   Weight:      Height:        Gen: well appearing CV: RRR Pulm: CTAB Abd: soft, ND, approp tender, fundus below umbilicus, NT Inc: C/D/I,  LE: tr edema, NT  CBC    Component Value Date/Time   WBC 16.8 (H) 10/23/2018 0500   RBC 2.88 (L) 10/23/2018 0500   HGB 8.7 (L) 10/23/2018 0500   HCT 25.4 (L) 10/23/2018 0500   PLT 207 10/23/2018 0500   MCV 88.2 10/23/2018 0500   MCH 30.2 10/23/2018 0500   MCHC 34.3 10/23/2018 0500   RDW 13.1 10/23/2018 0500    A/P: POD#2 s/p primary cesarean section - post-op. Doing well.  -Anemia.  Continue iron, status post IV iron.  Ambulate with assist at this time. Anticipate discharge tomorrow  Lendon Colonel 10/24/2018 8:35 AM

## 2018-10-24 NOTE — Lactation Note (Addendum)
This note was copied from a baby's chart. Lactation Consultation Note  Patient Name: Boy Rodney BoozeDaisy Lodge ZOXWR'UToday's Date: 10/24/2018 Reason for consult: Follow-up assessment;Hyperbilirubinemia;Other (Comment);1st time breastfeeding;Primapara;Infant < 6lbs;Early term 37-38.6wks;Infant weight loss(Double Photo tx / last Bili was 12.8 / repeat at 1800 tonight / 5 % weight loss )  Baby is 46 hours old  Per mom last fed at 1050 am and took 25 ml of EBM from a bottle .  Due to the Billi being so high LC recommended feeding with cues and by 3 hours with the bottle.  And pumping after feeding. If the 1800 serum Billirubin  Decreases can start working on latching for  Limited time and then supplementing EBM and or formula. Work on increasing volume to 30 ml for feeding.  LC recommended mom to call for assistance if baby is able to latch with the lights.  Per mom feels comfortable with settings on the DEBP and pumping.  Mom expressed excitement she had pumped off 25 ml earlier.  LC recommended writing down I/O's and pumpings. Sheet provided with dates.   LC praised mom for her efforts pumping and to continue to be consistent around the clock at least 8 times a day. ( think Of it like a feeding) .    Maternal Data Has patient been taught Hand Expression?: Yes(per mom has been shown / LC encouraged prior to pumpiing and after pumping to increase EBM yield )  Feeding Feeding Type: Bottle Fed - Breast Milk  LATCH Score                   Interventions Interventions: Breast feeding basics reviewed  Lactation Tools Discussed/Used     Consult Status Consult Status: Follow-up Date: 10/25/18 Follow-up type: In-patient    Matilde SprangMargaret Ann Serayah Yazdani 10/24/2018, 1:38 PM

## 2018-10-25 ENCOUNTER — Encounter (HOSPITAL_COMMUNITY): Payer: Self-pay | Admitting: *Deleted

## 2018-10-25 MED ORDER — IBUPROFEN 600 MG PO TABS: 600.0000 mg | ORAL_TABLET | Freq: Four times a day (QID) | ORAL | 0 refills | Status: AC

## 2018-10-25 MED ORDER — OXYCODONE HCL 5 MG PO TABS: 5.0000 mg | ORAL_TABLET | ORAL | 0 refills | Status: AC | PRN

## 2018-10-25 MED ORDER — MAGNESIUM OXIDE 400 (241.3 MG) MG PO TABS: 400.0000 mg | ORAL_TABLET | Freq: Every day | ORAL | Status: AC

## 2018-10-25 MED ORDER — POLYSACCHARIDE IRON COMPLEX 150 MG PO CAPS: 150.0000 mg | ORAL_CAPSULE | Freq: Every day | ORAL | Status: AC

## 2018-10-25 MED ORDER — COCONUT OIL OIL: 1.0000 "application " | TOPICAL_OIL | 0 refills | Status: AC | PRN

## 2018-10-25 MED ORDER — IBUPROFEN 600 MG PO TABS
600.0000 mg | ORAL_TABLET | Freq: Four times a day (QID) | ORAL | Status: DC
  Administered 2018-10-25: 600 mg via ORAL
  Filled 2018-10-25: qty 1

## 2018-10-25 MED ORDER — SIMETHICONE 80 MG PO CHEW: 80.0000 mg | CHEWABLE_TABLET | ORAL | 0 refills | Status: AC | PRN

## 2018-10-25 MED ORDER — SENNOSIDES-DOCUSATE SODIUM 8.6-50 MG PO TABS: 2.0000 | ORAL_TABLET | ORAL | Status: AC

## 2018-10-25 MED ORDER — ACETAMINOPHEN 500 MG PO TABS: 1000.0000 mg | ORAL_TABLET | Freq: Four times a day (QID) | ORAL | 0 refills | Status: AC | PRN

## 2018-10-25 MED ORDER — POLYSACCHARIDE IRON COMPLEX 150 MG PO CAPS
150.0000 mg | ORAL_CAPSULE | Freq: Every day | ORAL | Status: DC
  Administered 2018-10-25: 150 mg via ORAL
  Filled 2018-10-25: qty 1

## 2018-10-25 MED ORDER — MAGNESIUM OXIDE 400 (241.3 MG) MG PO TABS
400.0000 mg | ORAL_TABLET | Freq: Every day | ORAL | Status: DC
  Filled 2018-10-25 (×2): qty 1

## 2018-10-25 NOTE — Discharge Summary (Signed)
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 Placental abruption 10/22/2018  . S/P cesarean section 10/22/2018  . Postpartum care following cesarean delivery (1/15) 10/22/2018  . Antepartum placental abruption 10/22/2018                                                                Post partum procedures:none  Complications: None  Hospital course:  Sceduled C/S   33 y.o. yo G3P0020 at [redacted]w[redacted]d was admitted to the hospital 10/22/2018 for scheduled cesarean section with the following indication: IUGR, intraplacental hematoma and umbilical varix at 37 wks.  Membrane Rupture Time/Date: 2:51 PM ,10/22/2018   Patient delivered a Viable infant.10/22/2018  Details of operation can be found in separate operative note.  [name] had an uncomplicated postpartum course.  She is ambulating, tolerating a regular diet, passing flatus, and urinating well. Patient is discharged home in stable condition on  10/25/18         Physical exam  Vitals:   10/24/18 0613 10/24/18 1411 10/24/18 2300 10/25/18 0524  BP: 115/74 120/73 115/80 112/75  Pulse: 75 72 71 69  Resp: 16 18 18    Temp:   98.3 F (36.8 C) 98.2 F (36.8 C)  TempSrc:   Oral   SpO2:  99%  99%  Weight:      Height:       General: alert, cooperative and no distress Lochia: appropriate Uterine Fundus: firm Incision: Healing well with no significant drainage, Dressing is clean, dry, and  intact DVT Evaluation: No cords or calf tenderness. No significant calf/ankle edema. Labs: Lab Results  Component Value Date   WBC 16.8 (H) 10/23/2018   HGB 8.7 (L) 10/23/2018   HCT 25.4 (L) 10/23/2018   MCV 88.2 10/23/2018   PLT 207 10/23/2018   No flowsheet data found.  Vaccines: TDaP UTD         Flu    UTD  Discharge instruction: per After Visit Summary and "Baby and Me Booklet".  After Visit Meds:  Allergies as of 10/25/2018   No Known Allergies     Medication List    TAKE these medications   acetaminophen 500 MG tablet Commonly known as:  TYLENOL Take 2 tablets (1,000 mg total) by mouth every 6 (six) hours as needed for moderate pain.   coconut oil Oil Apply 1 application topically as needed.   ibuprofen 600 MG tablet Commonly known as:  ADVIL,MOTRIN Take 1 tablet (600 mg total) by mouth every 6 (six) hours.   iron polysaccharides 150 MG capsule Commonly known as:  NIFEREX Take 1 capsule (150 mg total) by mouth daily.   magnesium oxide 400 (241.3 Mg) MG tablet Commonly known as:  MAG-OX Take 1 tablet (400 mg total) by mouth daily.   oxyCODONE 5 MG immediate release  tablet Commonly known as:  Oxy IR/ROXICODONE Take 1-2 tablets (5-10 mg total) by mouth every 4 (four) hours as needed for moderate pain.   PRENATAL VITAMINS PO Take 1 tablet by mouth daily.   senna-docusate 8.6-50 MG tablet Commonly known as:  Senokot-S Take 2 tablets by mouth daily. Start taking on:  October 26, 2018   simethicone 80 MG chewable tablet Commonly known as:  MYLICON Chew 1 tablet (80 mg total) by mouth as needed for flatulence.       Diet: routine diet  Activity: Advance as tolerated. Pelvic rest for 6 weeks.   Postpartum contraception: Not Discussed  Newborn Data: Live born female  Birth Weight: 5 lb 4.3 oz (2390 g) APGAR: 8, 9  Newborn Delivery   Birth date/time:  10/22/2018 14:51:00 Delivery type:  C-Section, Low Transverse C-section categorization:   Primary     named Maria Peterson Baby Feeding: Bottle and EBM Disposition:rooming in for phototherapy   Delivery Report:  Review the Delivery Report for details.    Follow up: Follow-up Information    Olivia Mackie, MD. Schedule an appointment as soon as possible for a visit in 6 week(s).   Specialty:  Obstetrics and Gynecology Contact information: [address] [place] [address] [phone]             Signed: Cipriano Mile, MSN 10/25/2018, 9:01 AM

## 2018-10-25 NOTE — Lactation Note (Signed)
This note was copied from a baby's chart. Lactation Consultation Note  Patient Name: Maria Peterson FIEPP'I Date: 10/25/2018  Follow up.  [name] Maria Peterson now 57 hours old.  Mom reprots he is not latching.  Assisted mom with trying to latch him.  Mom has large full breasts and short shaft large nipples.Infant unable to latch.  Tried RPS and some hand expression.  Infant able to latch but unable to maintain.  Asisst with 24 mm nipple shield.  Infant able to latch and audible swallows heard.  However mom reports uncomfortable.  Broke suction./ Took infant off breast and nipple slightly compressed.  Tried to relatch infant and mom still uncomfortable. So ended feeding and mom feed expressed breastmilk from bottle.  Infant early term and less than six pounds.  Urged mom to keep working with him that as he got bigger he should get better with breastfeeding.  Urged mom to keep working with him and pump 8-12 times day to establish good milk production.Urged to try and pump each time bottle offered. Urged mom to follow up with lactation as needed   Maternal Data    Feeding    LATCH Score                   Interventions    Lactation Tools Discussed/Used     Consult Status      Neomia Dear 10/25/2018, 7:40 PM

## 2018-11-13 ENCOUNTER — Inpatient Hospital Stay (HOSPITAL_COMMUNITY)
Admission: AD | Admit: 2018-11-13 | Discharge: 2018-11-13 | Disposition: A | Discharge status: Home / Self Care | Payer: BC Managed Care – PPO | Source: Ambulatory Visit | Attending: Obstetrics and Gynecology | Admitting: Obstetrics and Gynecology

## 2018-11-13 ENCOUNTER — Encounter (HOSPITAL_COMMUNITY): Payer: Self-pay

## 2018-11-13 DIAGNOSIS — N644 Mastodynia: Secondary | ICD-10-CM | POA: Diagnosis present

## 2018-11-13 DIAGNOSIS — Z98891 History of uterine scar from previous surgery: Secondary | ICD-10-CM

## 2018-11-13 DIAGNOSIS — O9229 Other disorders of breast associated with pregnancy and the puerperium: Secondary | ICD-10-CM

## 2018-11-13 NOTE — MAU Provider Note (Signed)
Chief Complaint:  Breast Pain   First Provider Initiated Contact with Patient 11/13/18 2243       HPI: Maria Peterson is a 33 y.o. [id number] who presents to maternity admissions reporting intermittent sharp pains in her left breast.  Last felt them 2 days ago.  No areas of redness or tenderness.  No fever.  Did have a fever last week with "the runs" which resolved in 2 days.  Has been pumping exclusively   States milk supply has been diminishing.  Pains are random and come from chest wall outward.  Only in left breast. . She reports no vaginal bleeding, vaginal itching/burning, urinary symptoms, h/a, dizziness, n/v, or fever/chills.    Wearing a nonsupportive sports bra.  Does not wear a supportive breastfeeding bra.  HPI RN Note: Delivered 1/15.  Currently exclusively pumping-has had 2 episodes of shooting pain through her left breast.  States she has felt some knots in her breast occasionally but is able to massage them out while pumping.  Reports a fever last week for 2 days but none this week.  States she is still producing milk in both breasts. Called lactation and they said to come in to see a physician.    Past Medical History: Past Medical History:  Diagnosis Date  . Medical history non-contributory   . Uterine abnormality in pregnancy     Past obstetric history: OB History  Gravida Para Term Preterm AB Living  3 1 1   2 1   SAB TAB Ectopic Multiple Live Births  1 1     1     # Outcome Date GA Lbr Len/2nd Weight Sex Delivery Anes PTL Lv  3 Term 10/22/18 [redacted]w[redacted]d   M CS-LTranv   LIV  2 TAB           1 SAB             Past Surgical History: Past Surgical History:  Procedure Laterality Date  . BREAST CYST EXCISION    . CESAREAN SECTION N/A 10/22/2018   Procedure: Primary CESAREAN SECTION;  Surgeon: Olivia Mackie, MD;  Location: [facility] BIRTHING SU[facility];  Service: Obstetrics;  Laterality: N/A;  EDD: 11/12/18    Family History: Family History  Problem Relation Age of Onset  .  Heart disease Paternal Uncle   . Ovarian cancer Maternal Grandmother   . Heart disease Paternal Grandmother     Social History: Social History   Tobacco Use  . Smoking status: Never Smoker  . Smokeless tobacco: Never Used  Substance Use Topics  . Alcohol use: Not Currently  . Drug use: Never    Allergies: No Known Allergies  Meds:  Medications Prior to Admission  Medication Sig Dispense Refill Last Dose  . coconut oil OIL Apply 1 application topically as needed.  0 11/12/2018 at Unknown time  . Prenatal Multivit-Min-Fe-FA (PRENATAL VITAMINS PO) Take 1 tablet by mouth daily.    11/13/2018 at Unknown time  . acetaminophen (TYLENOL) 500 MG tablet Take 2 tablets (1,000 mg total) by mouth every 6 (six) hours as needed for moderate pain. 30 tablet 0 Unknown at Unknown time  . ibuprofen (ADVIL,MOTRIN) 600 MG tablet Take 1 tablet (600 mg total) by mouth every 6 (six) hours. 30 tablet 0 Unknown at Unknown time  . iron polysaccharides (NIFEREX) 150 MG capsule Take 1 capsule (150 mg total) by mouth daily.   Unknown at Unknown time  . magnesium oxide (MAG-OX) 400 (241.3 Mg) MG tablet Take 1 tablet (400 mg  total) by mouth daily.   Unknown at Unknown time  . oxyCODONE (OXY IR/ROXICODONE) 5 MG immediate release tablet Take 1-2 tablets (5-10 mg total) by mouth every 4 (four) hours as needed for moderate pain. 30 tablet 0 Unknown at Unknown time  . senna-docusate (SENOKOT-S) 8.6-50 MG tablet Take 2 tablets by mouth daily.   Unknown at Unknown time  . simethicone (MYLICON) 80 MG chewable tablet Chew 1 tablet (80 mg total) by mouth as needed for flatulence. 30 tablet 0 Unknown at Unknown time    I have reviewed patient's Past Medical Hx, Surgical Hx, Family Hx, Social Hx, medications and allergies.  ROS:  Review of Systems  Constitutional: Negative for chills and fever.  Respiratory: Negative for shortness of breath.   Cardiovascular: Negative for chest pain and leg swelling.  Gastrointestinal:  Negative for abdominal pain, nausea and vomiting.  Genitourinary: Negative for dysuria and pelvic pain.  Skin: Negative for color change, pallor, rash and wound.   Other systems negative     Physical Exam   Patient Vitals for the past 24 hrs:  BP Temp Pulse Resp SpO2  11/13/18 2229 130/80 98.5 F (36.9 C) 63 17 98 %   Constitutional: Well-developed, well-nourished female in no acute distress.  Cardiovascular: normal rate and rhythm, no ectopy audible, S1 & S2 heard, no murmur Respiratory: normal effort, no distress. Lungs CTAB with no wheezes or crackles Breast exam normal.  Pumping right breast during exam, + milk.  Left breast is soft and nontender.  No erethema. No masses or induration.  Cannot reproduce pain. GI: Abd soft, non-tender.  Nondistended.  No rebound, No guarding.  Bowel Sounds audible  MS: Extremities nontender, no edema, normal ROM Neurologic: Alert and oriented x 4.   Grossly nonfocal. GU: Neg CVAT. Skin:  Warm and Dry Psych:  Affect appropriate.    Labs: No results found for this or any previous visit (from the past 24 hour(s)). --/--/O POS, O POS [facility], [address] [place], [place] [id number]  (01/14 1032)  Imaging:  No results found.  MAU Course/MDM: I have ordered labs as follows:none Imaging ordered: none.   Treatments in MAU included pumping by pt.   Discussed this may be related to lack of support and pulling of connective tissue between chest wall and breast.  Also may be related to lower milk production.  Advised trying ice when pain comes.  Recommend Lactation consult in person next week.  Push fluids to ensure hydration.  .   Pt stable at time of discharge.  Assessment: Postpartum mastalgia, left  Plan: Discharge home Recommend Lactation consult Use ibuprofen and ice for pain.  Encouraged to return here or to other Urgent Care/ED if she develops worsening of symptoms, increase in pain, fever, or other  concerning symptoms.   Wynelle BourgeoisMarie Kiaya Haliburton CNM, MSN Certified Nurse-Midwife 11/13/2018 10:43 PM

## 2018-11-13 NOTE — MAU Note (Signed)
Delivered 1/15.  Currently exclusively pumping-has had 2 episodes of shooting pain through her left breast.  States she has felt some knots in her breast occasionally but is able to massage them out while pumping.  Reports a fever last week for 2 days but none this week.  States she is still producing milk in both breasts. Called lactation and they said to come in to see a physician.

## 2018-11-13 NOTE — Discharge Instructions (Signed)
Breast Pumping Tips There may be times when you cannot feed your baby from your breast, such as when you are at work or on a trip. Breast pumping allows you to remove milk from your breast in order to store for later use. There are three ways to pump. You can use:  Your hand to massage and squeeze your breast (hand expression).  A handheld manual pump.  An electric pump. When you first start to pump, you may not get much milk, but after a few days your breasts should start to make more. Pumping can help stimulate your milk supply after your baby is born. It can also help maintain your milk supply when you are away from your baby. When should I pump? You can start pumping soon after your baby is born. Here are some tips on when to pump:  When with your baby: ? Pump after breastfeeding. ? Pump from the free breast while you breastfeed.  When away from your baby: ? Pump every 2-3 hours for about 15 minutes. ? Pump both breasts at the same time if you can.  If your baby gets formula feeding, pump around the time your baby gets that feeding.  If you drank alcohol, wait 2 hours before pumping.  If you are having a procedure with anesthesia, talk to your health care provider about when you should pump before and after. How do I prepare to pump? Take steps to relax. This makes it easier to stimulate your let-down reflex, which is what makes breast milk flow. To help:  Smell one of your infant's blankets or an item of clothing.  Look at a picture or video of your infant.  Sit in a quiet, private space.  Massage your breast and nipple.  Place a warm cloth on your breast. The cloth should be a little wet.  Play relaxing music.  Picture your milk flowing. What are some tips? General tips for pumping breast milk  Always wash your hands before pumping.  If you are not getting very much milk or pumping is uncomfortable, make adjustments to your pump or try using different type of  pumps.  Drink enough fluid to keep your urine clear or pale yellow.  Wear clothing that opens in the front or allows easy access to your breasts.  Pump breast milk directly into clean bottles or other storage containers.  Do not use any products that contain nicotine or tobacco, such as cigarettes and e-cigarettes. These can lower your milk supply and harm your infant. If you need help quitting, ask your health care provider. Tips for storing breast milk  Store breast milk in a clean, BPA-free container, such as glass or plastic bottles or milk storage bags.  Store breast milk in 2-4 ounce batches to reduce waste.  Swirl the breast milk in the container to mix any cream that floats to the top. Do not shake it.  Label all stored milk with the date you pumped it.  The amount of time you can keep breast milk depends on where it is stored: ? Room temperature: 6-8 hours, if the milk is clean. It is best if used within 4 hours. ? Cooler with ice packs: 24 hours. ? Refrigerator: 5-8 days, if the milk is clean. It is best if used within 3 days. ? Freezer: 9-12 months, if the milk is clean and stored away from the freezer door. It is best if used within 6 months.  When using a refrigerator or freezer,  put the milk in the back to keep it as cold as possible.  Thaw frozen milk using warm water. Do not use the microwave. Tips for choosing a breast pump The right pump for you will depend on your comfort and how often you will be away from your baby. When choosing a pump, consider the following:  Manual breast pumps do not need electricity to work. They are usually cheaper than electric pumps, but they can be harder to use. They may be a good choice if you are occasionally away from your baby.  Electric breast pumps are usually more expensive than manual pumps, but they can be easier for some women to use. They can also collect more milk than manual pumps. This makes them a good choice for women  who work in an office or need to be away from their baby for longer periods of time.  The suction cup (flange) should be the right size. If it is the wrong size, it may cause pain and nipple damage.  Before buying a pump, find out whether your insurance covers the cost of a breast pump. Tips for maintaining a breast pump  Check your pump's manual for cleaning tips.  Clean the pump after each use. To do this: ? Wipe down the electrical unit. Use a dry, soft cloth or clean paper towel. Do not put the electrical unit in water or cleaning products. ? Wash the plastic pump parts with soap and warm water or in the dishwasher, if the parts are dishwasher safe. You do not need to clean the tubing unless it comes in contact with breast milk. Let the parts air dry. Avoid drying them with a cloth or towel. ? When the pump parts are clean and dry, put the pump back together. Then store the pump.  If there is water in the tubing when it comes time to pump, attach the tubing to the pump and turn on the pump. Run the pump until the tube is dry.  Avoid touching the inside of pump parts that come in contact with breast milk. Summary  Pumping can help stimulate your milk supply after your baby is born. It can also help maintain your milk supply when you are away from your baby.  When you are away from your infant for several hours, pump for about 15 minutes every 2-3 hours. Pump both breasts at the same time, if you can.  Your health care provider or lactation consultant can help you decide which breast pump is right for you. The right pump for you depends on your comfort, work schedule, and how often you may be away from your baby. This information is not intended to replace advice given to you by your health care provider. Make sure you discuss any questions you have with your health care provider. Document Released: 03/14/2010 Document Revised: 06/13/2018 Document Reviewed: 10/29/2016 Elsevier Interactive  Patient Education  2019 [employer]  Breast Tenderness Breast tenderness is a common problem for women of all ages. Breast tenderness may cause mild discomfort to severe pain. The pain usually comes and goes in association with your menstrual cycle, but it can be constant. Breast tenderness has many possible causes, including hormone changes and some medicines. Your health care provider may order tests, such as a mammogram or an ultrasound, to check for any unusual findings. Having breast tenderness usually does not mean that you have breast cancer. Follow these instructions at home: Sometimes, reassurance that you do not have  breast cancer is all that is needed. In general, follow these home care instructions: Managing pain and discomfort   If directed, apply ice to the area: ? Put ice in a plastic bag. ? Place a towel between your skin and the bag. ? Leave the ice on for 20 minutes, 2-3 times a day.  Make sure you are wearing a supportive bra, especially during exercise. You may also want to wear a supportive bra while sleeping if your breasts are very tender. Medicines  Take over-the-counter and prescription medicines only as told by your health care provider. If the cause of your pain is infection, you may be prescribed an antibiotic medicine.  If you were prescribed an antibiotic, take it as told by your health care provider. Do not stop taking the antibiotic even if you start to feel better. General instructions   Your health care provider may recommend that you reduce the amount of fat in your diet. You can do this by: ? Limiting fried foods. ? Cooking foods using methods, such as baking, boiling, grilling, and broiling.  Decrease the amount of caffeine in your diet. You can do this by drinking more water and choosing caffeine-free options.  Keep a log of the days and times when your breasts are most tender.  Ask your health care provider how to do breast exams at home. This  will help you notice if you have an unusual growth or lump. Contact a health care provider if:  Any part of your breast is hard, red, and hot to the touch. This may be a sign of infection.  You are not breastfeeding and you have fluid, especially blood or pus, coming out of your nipples.  You have a fever.  You have a new or painful lump in your breast that remains after your menstrual period ends.  Your pain does not improve or it gets worse.  Your pain is interfering with your daily activities. This information is not intended to replace advice given to you by your health care provider. Make sure you discuss any questions you have with your health care provider. Document Released: 09/06/2008 Document Revised: 06/22/2016 Document Reviewed: 06/22/2016 Elsevier Interactive Patient Education  2019 [employer].

## 2020-03-21 IMAGING — US US MFM OB DETAIL+14 WK
1 series · 13 of 28 positions shown · non-contrast
Comparison: none

[Series 1: us mfm ob detail+14 wk · 83 acquisitions, 13 frames shown]
[im 4/83]
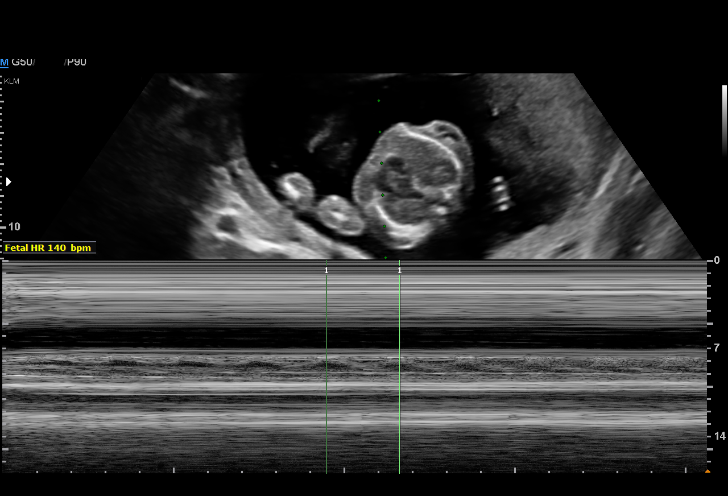
[im 10/83]
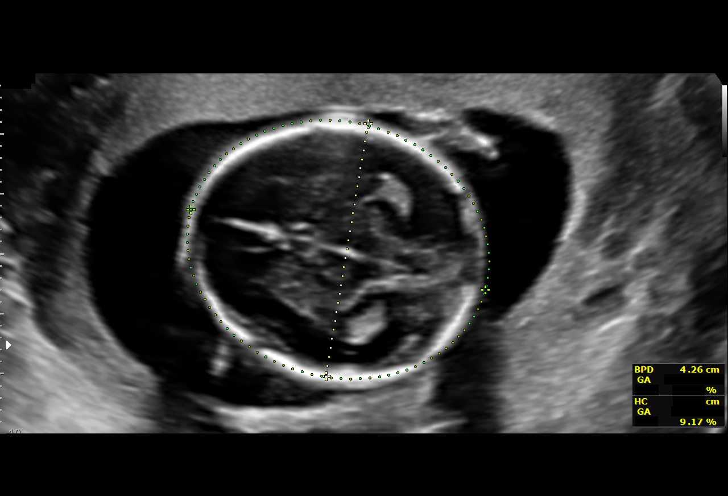
[im 16/83]
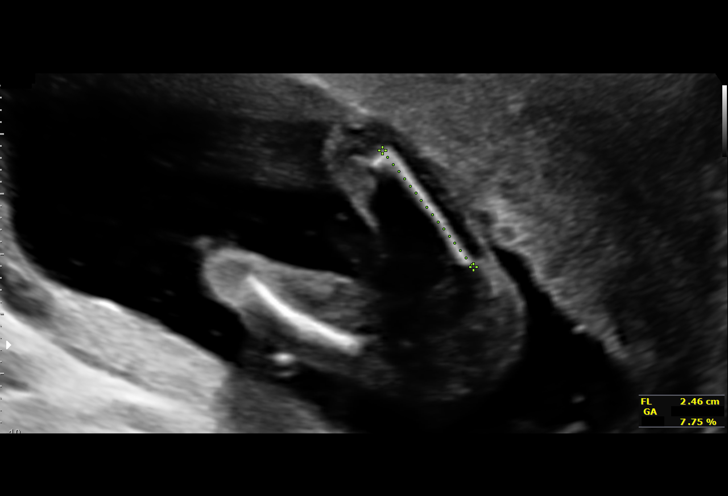
[im 22/83]
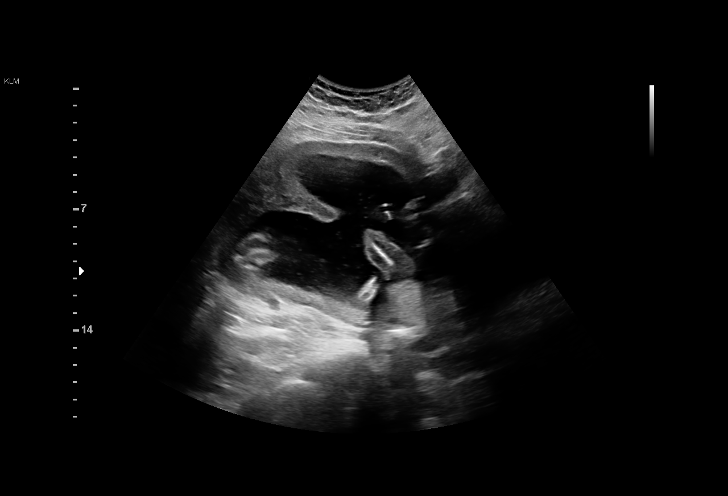
[im 28/83]
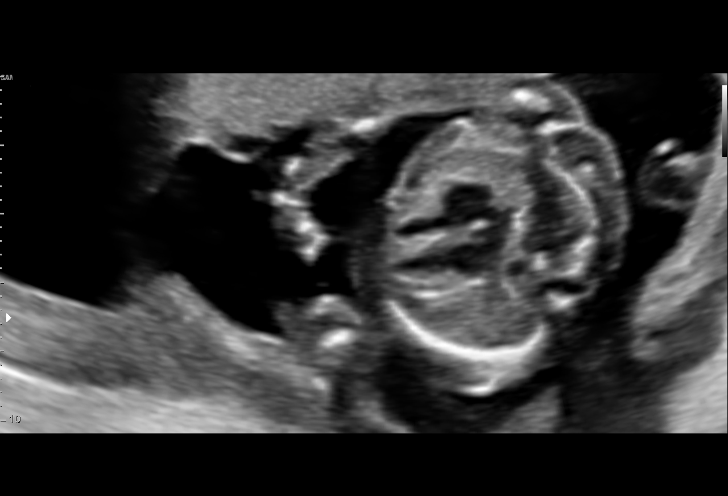
[im 34/83]
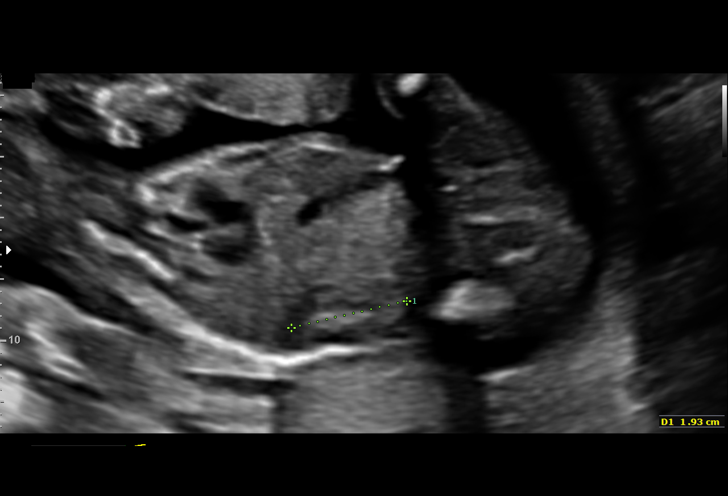
[im 43/83]
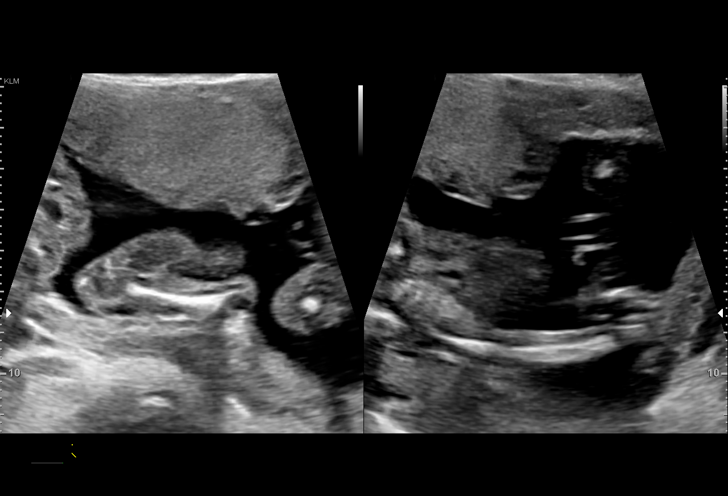
[im 49/83]
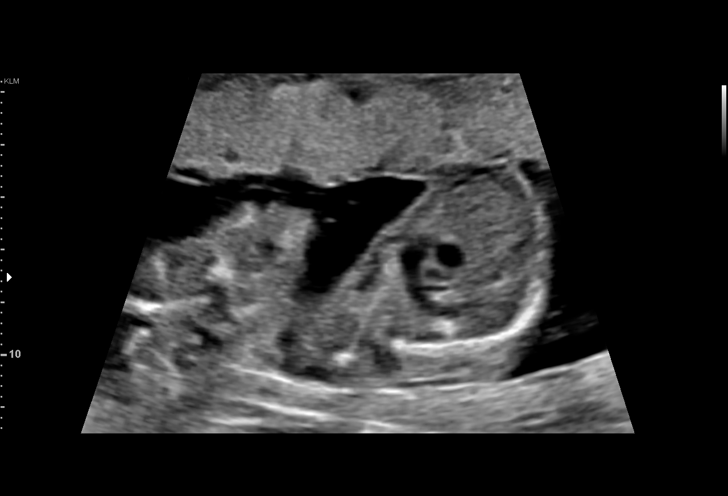
[im 55/83]
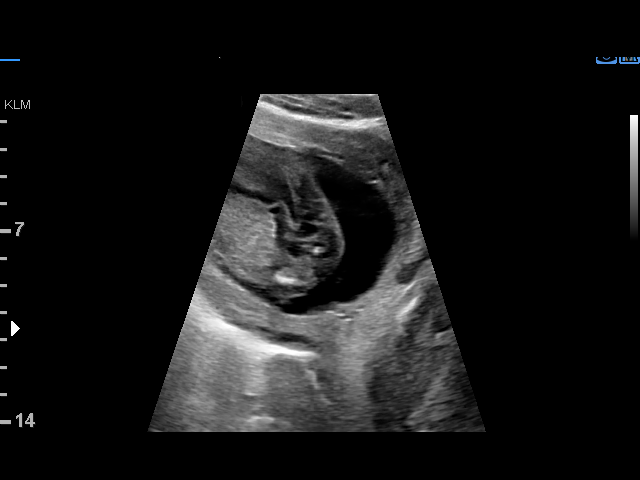
[im 61/83]
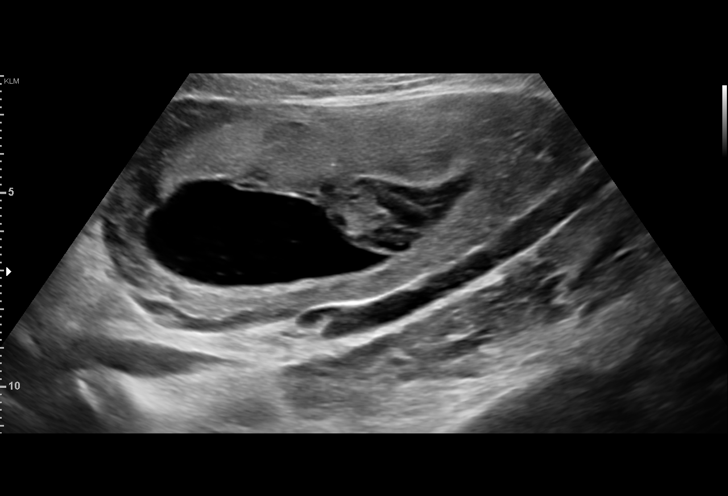
[im 67/83]
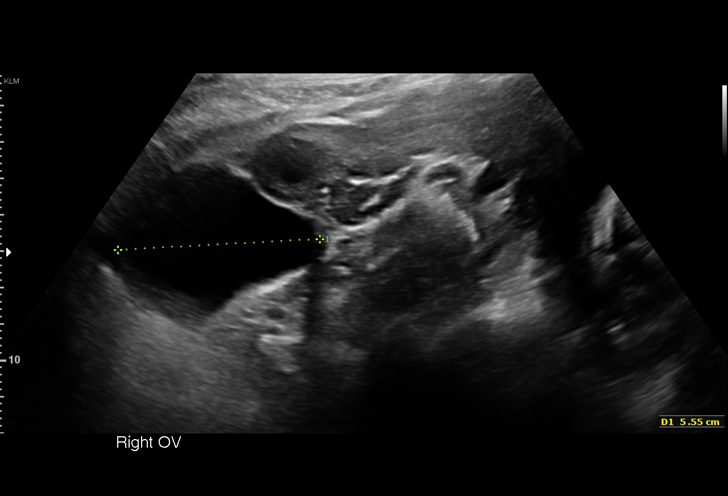
[im 73/83]
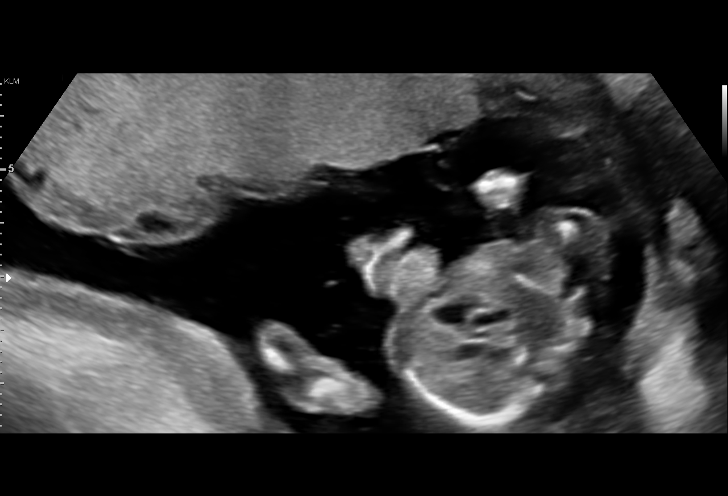
[im 79/83]
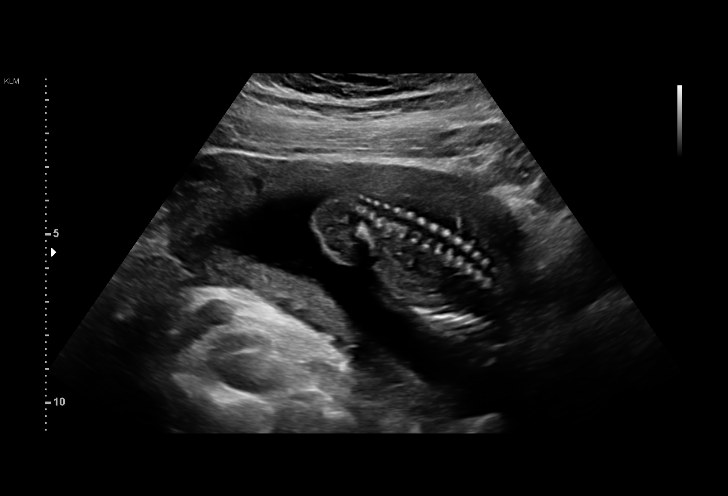

[13 of 28 positions shown; findings below may reference images not displayed]

& Infertility
[id number] [REDACTED]

Indications

Antenatal screening for malformations
18 weeks gestation of pregnancy
Subchorionic hemorrhage, antepartum
Bicornuate uterus, 2ND trimester
Abnormal biochemical screen (ONTD [DATE])
Elevated MSAFP (3.86)
Fetal Evaluation

Num Of Fetuses:         1
Fetal Heart Rate(bpm):  140
Cardiac Activity:       Observed
Presentation:           Transverse, head to maternal right
Placenta:               Anterior
P. Cord Insertion:      Visualized

Amniotic Fluid
AFI FV:      Within normal limits
Biometry

BPD:      42.2  mm     G. Age:  18w 5d         54  %    CI:        81.05   %    70 - 86
FL/HC:      16.6   %    16.1 -
HC:       148   mm     G. Age:  17w 6d         11  %    HC/AC:      1.07        1.09 -
AC:      138.5  mm     G. Age:  19w 2d         64  %    FL/BPD:     58.3   %
FL:       24.6  mm     G. Age:  17w 3d          8  %    FL/AC:      17.8   %    20 - 24
HUM:      24.3  mm     G. Age:  17w 4d         18  %
CER:      18.5  mm     G. Age:  18w 2d         35  %

Est. FW:     236  gm      0 lb 8 oz     38  %
OB History

Gravidity:    3         Term:   0        Prem:   0        SAB:   1
TOP:          1       Ectopic:  0        Living: 0
Gestational Age

LMP:           18w 5d        Date:  02/05/18                 EDD:   11/12/18
U/S Today:     18w 2d                                        EDD:   11/15/18
Best:          18w 5d     Det. By:  LMP  (02/05/18)          EDD:   11/12/18
Anatomy

Cranium:               Appears normal         Aortic Arch:            Appears normal
Cavum:                 Appears normal         Ductal Arch:            Appears normal
Ventricles:            Appears normal         Diaphragm:              Appears normal
Choroid Plexus:        Appears normal         Stomach:                Appears normal, left
sided
Cerebellum:            Appears normal         Abdomen:                Appears normal
Posterior Fossa:       Appears normal         Abdominal Wall:         Appears nml (cord
insert, abd wall)
Nuchal Fold:           Appears normal         Cord Vessels:           Appears normal (3
vessel cord)
Face:                  Appears normal         Kidneys:                Appear normal
(orbits and profile)
Lips:                  Appears normal         Bladder:                Appears normal
Thoracic:              Appears normal         Spine:                  Ltd views no
intracranial signs of
NT
Heart:                 Appears normal         Upper Extremities:      Appears normal
(4CH, axis, and situs
RVOT:                  Appears normal         Lower Extremities:      Appears normal
LVOT:                  Appears normal

Other:  Male gender. Heels and 5th digit visualized. Nasal bone visualized.
Cervix Uterus Adnexa

Cervix
Length:            3.5  cm.
Normal appearance by transabdominal scan.

Uterus
Bicornuate.

Left Ovary
Within normal limits.

Right Ovary
Small corpus luteum noted.

Cul De Sac
No free fluid seen.

Adnexa
Right Adnexal Paraovarian cyst- 0.0x0.3x7.0cm
Impression

Ms. Arbuz, Dralexandr3 P0 at 18-weeks' gestation, is here for fetal
anatomy scan. On cell-free fetal DNA screening, the risks of
fetal aneuploidies are not increased. MSAFP screening
showed increased risk for open-neural tube defects (1 in 32)
and the AFP was 3.86 MoM.
Patient reports vaginal bleeding in early pregnancy.

We performed a fetal anatomy scan. No markers of
aneuploidies or fetal structural defects are seen. Intracranial
structures, abdominal wall and the kidneys look normal.
Although the spine views are limited, it appears normal. Fetal
biometry is consistent with her previously-established dates.
Amniotic fluid is normal and good fetal activity is seen.
Patient understands the limitations of ultrasound in detecting
fetal anomalies.
On transabdominal scan, the cervix looks long and closed.
We could not clearly confirm bicornuate uterus.

I counseled the patient on the following:
Increased risk for open-neural tube defects and increased
AFP: I reassured the patient of normal fetal anatomy on
ultrasound including spine and intracranial structures. I
informed her that ultrasound can detect up to [DATE]
cases of spina bifida and that amniocentesis will only
marginally improve the detection rate.
Increased AFP can also be associated with fetal growth
restriction (placental insufficiency), preterm delivery and
stillbirth (very rare). I recommended serial fetal growth
assessments. It can also follow vaginal bleeding.
I recommended genetic counseling. The couple would like to
return in 4 weeks (revisit spine) and make a decision on
genetic counseling. Patient opted not to have amniocentesis.
Recommendations

An appointment was made for her to return in 4 weeks for
completion of fetal anatomy (spine).

## 2020-05-25 IMAGING — US US MFM OB FOLLOW-UP
1 series · 13 of 28 positions shown · non-contrast
Comparison: none

[Series 1: us mfm ob follow-up · 60 acquisitions, 13 frames shown]
[im 3/60]
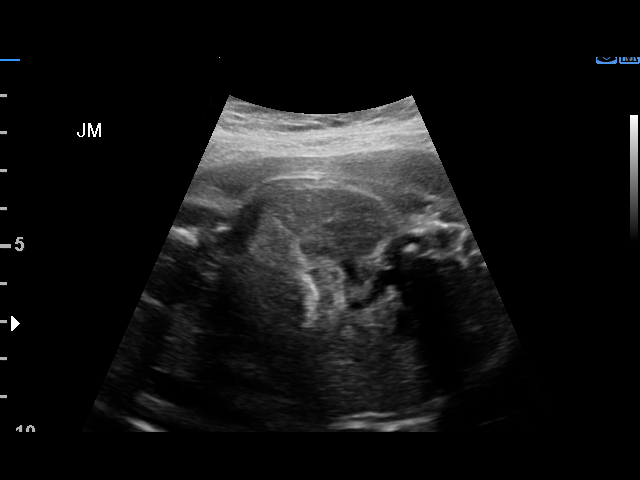
[im 7/60]
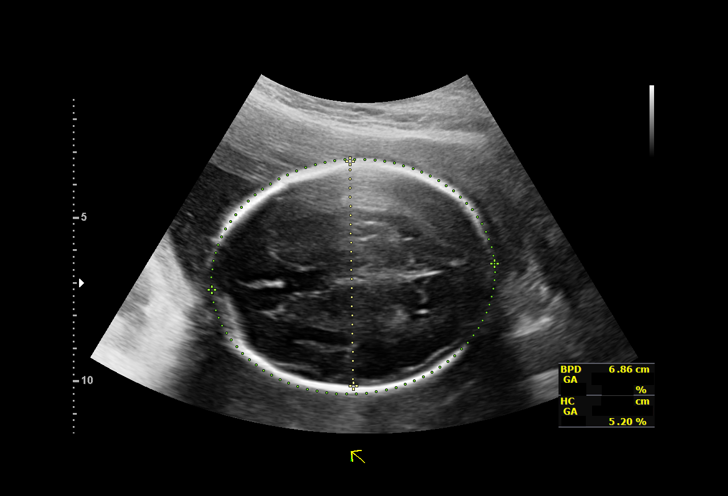
[im 11/60]
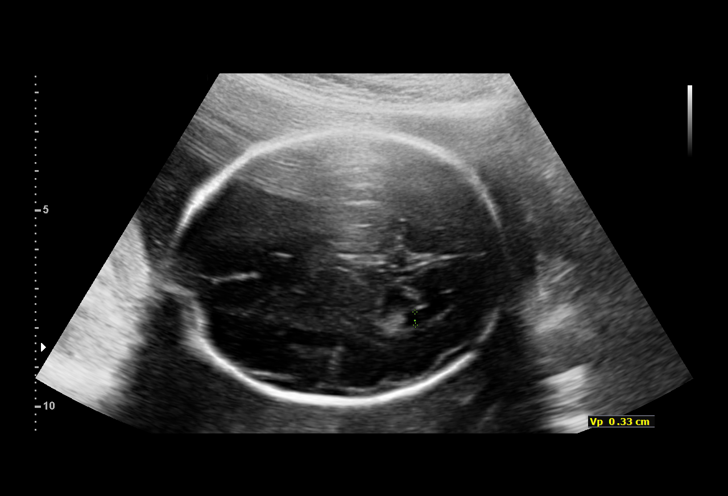
[im 16/60]
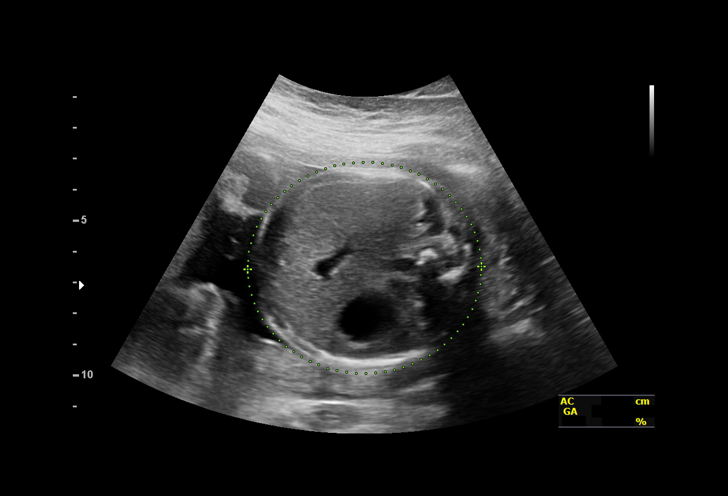
[im 20/60]
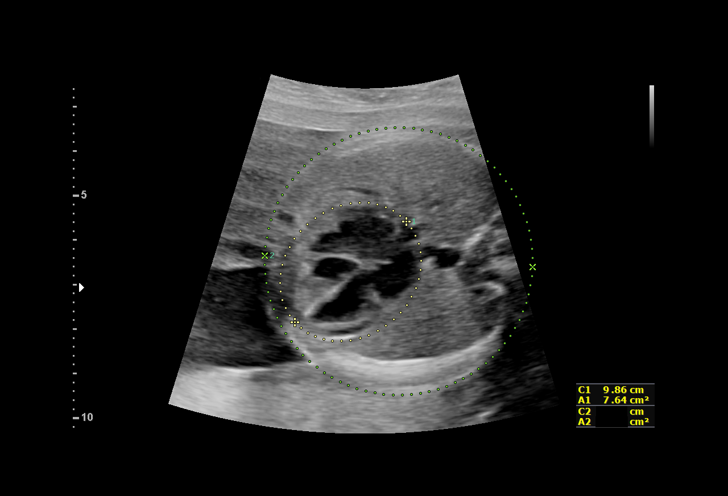
[im 25/60]
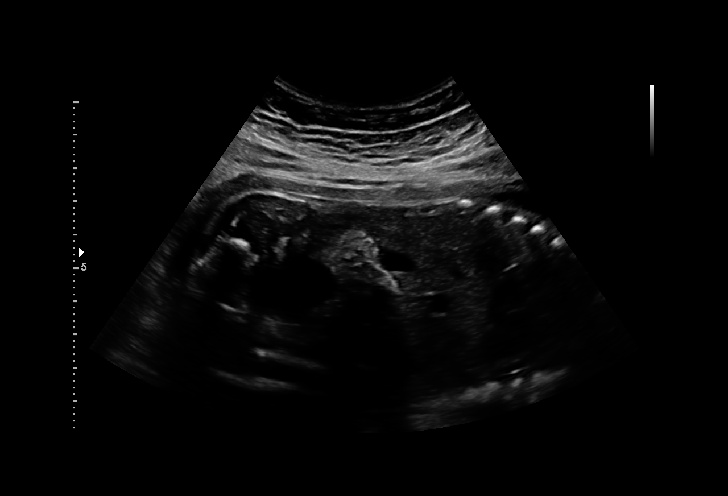
[im 31/60]
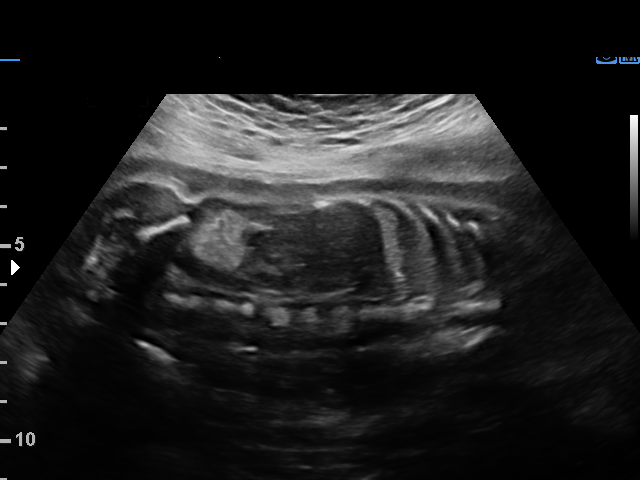
[im 35/60]
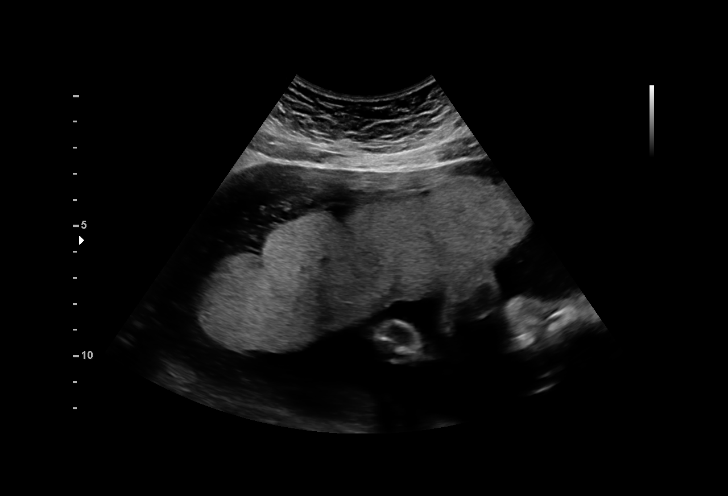
[im 40/60]
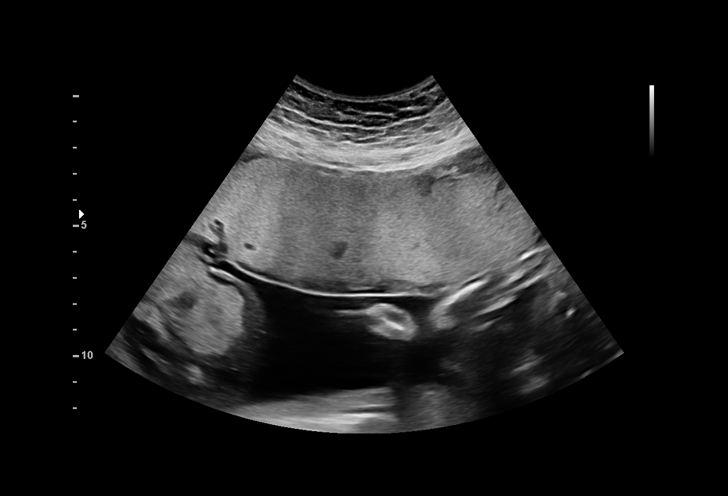
[im 44/60]
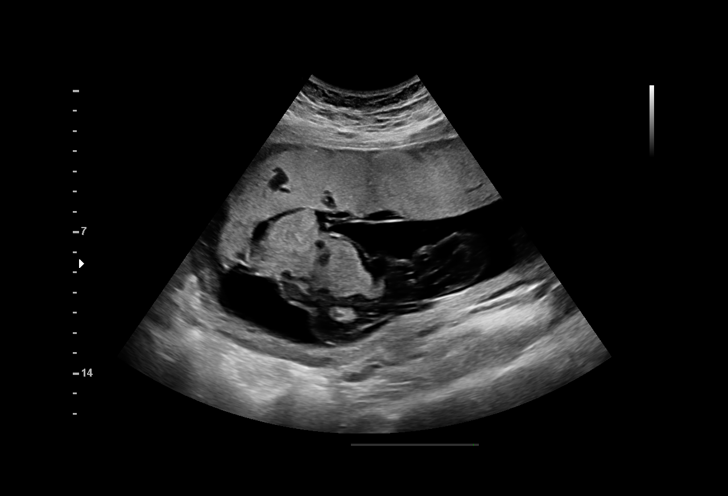
[im 49/60]
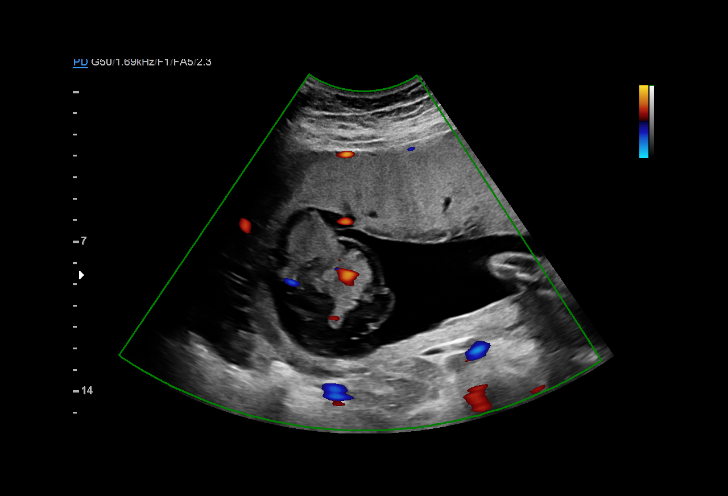
[im 53/60]
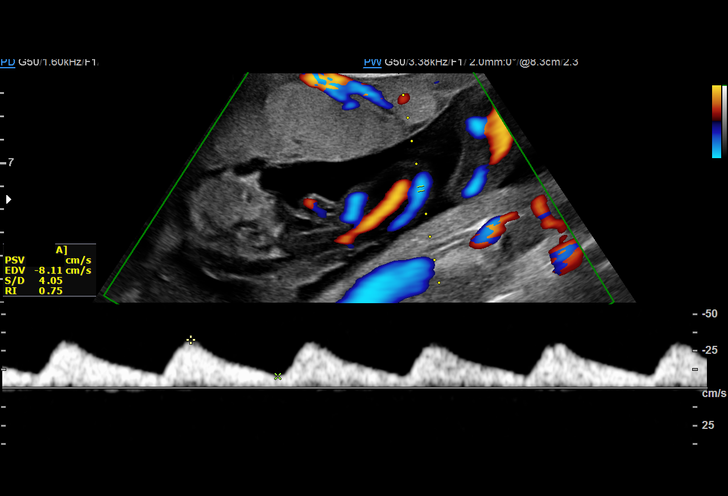
[im 57/60]
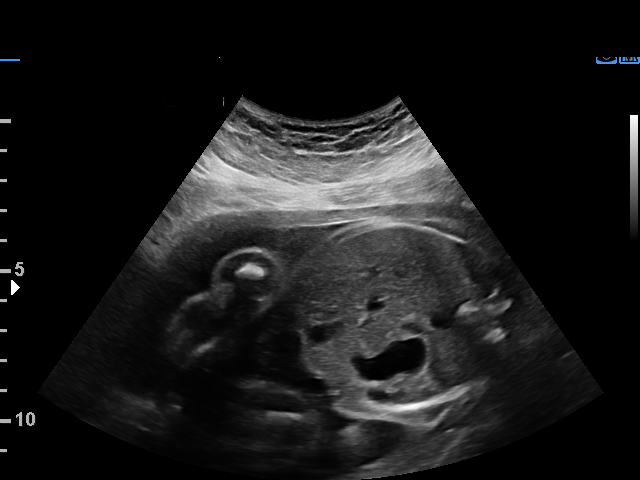

[13 of 28 positions shown; findings below may reference images not displayed]

& Infertility
                                                            [id number] [REDACTED]

 ----------------------------------------------------------------------

 ----------------------------------------------------------------------
Indications

  Antenatal follow-up for nonvisualized fetal
  anatomy
  Bicornuate uterus, 2ND trimester
  Abnormal biochemical screen (ONTD [DATE]) -
  low risk NIPS
  Elevated MSAFP (3.86)
  28 weeks gestation of pregnancy
 ----------------------------------------------------------------------
Vital Signs

 BMI:
Fetal Evaluation

 Num Of Fetuses:         1
 Fetal Heart Rate(bpm):  158
 Cardiac Activity:       Observed
 Presentation:           Cephalic
 Placenta:               Anterior
 P. Cord Insertion:      Abnormal

 Amniotic Fluid
 AFI FV:      Within normal limits

                             Largest Pocket(cm)

Biophysical Evaluation
 Amniotic F.V:   Within normal limits       F. Tone:        Observed
 F. Movement:    Observed                   Score:          [DATE]
 F. Breathing:   Observed
Biometry

 BPD:      68.7  mm     G. Age:  27w 4d         26  %    CI:        76.91   %    70 - 86
                                                         FL/HC:      18.5   %    18.8 -
 HC:      248.1  mm     G. Age:  26w 6d          4  %    HC/AC:      1.11        1.05 -
 AC:      223.3  mm     G. Age:  26w 5d         12  %    FL/BPD:     67.0   %    71 - 87
 FL:         46  mm     G. Age:  25w 2d        < 3  %    FL/AC:      20.6   %    20 - 24
 HUM:      42.1  mm     G. Age:  25w 2d        < 5  %

 LV:        3.3  mm

 Est. FW:     918  gm           2 lb     21  %
OB History

 Gravidity:    3         Term:   0        Prem:   0        SAB:   1
 TOP:          1       Ectopic:  0        Living: 0
Gestational Age

 LMP:           28w 0d        Date:  02/05/18                 EDD:   11/12/18
 U/S Today:     26w 4d                                        EDD:   11/22/18
 Best:          28w 0d     Det. By:  LMP  (02/05/18)          EDD:   11/12/18
Anatomy

 Cranium:               Appears normal         Aortic Arch:            Previously seen
 Cavum:                 Appears normal         Ductal Arch:            Previously seen
 Ventricles:            Appears normal         Diaphragm:              Appears normal
 Choroid Plexus:        Previously seen        Stomach:                Appears normal, left
                                                                       sided
 Cerebellum:            Previously seen        Abdomen:                Echogenic Bowel
 Posterior Fossa:       Previously seen        Abdominal Wall:         Previously seen
 Nuchal Fold:           Previously seen        Cord Vessels:           Previously seen
 Face:                  Orbits and profile     Kidneys:                Appear normal
                        previously seen
 Lips:                  Previously seen        Bladder:                Appears normal
 Thoracic:              Appears normal         Spine:                  Previously seen
 Heart:                 Previously seen        Upper Extremities:      Previously seen
 RVOT:                  Previously seen        Lower Extremities:      Previously seen
 LVOT:                  Previously seen

 Other:  Heels and 5th digit previously visualized. Nasal bone visualized.
Cervix Uterus Adnexa

 Cervix
 Not visualized (advanced GA >48wks)
Comments

 U/S images reviewed. Findings reviewed with patient.
 Appropriate fetal growth is noted.   Echogenic bowel is seen
 for the first time.  This may represent swallowed blood in the
 intestines which then attracts calcium.  No other fetal
 abnormalities are seen.  Umbilical cord insertion into the
 placenta is surrounded by a placental mass that may
 represent an intra-placental hematoma.i.e.an abruption) .  No
 evidence of fetal compromise is found on BPP today.  Normal
 UA dopplers are interrogated.
 Discussed findings with Dr. Krina as well as
 recommendations..
 Questions answered.
 20 minutes spent face to face with patient.
 Recommendations: 1)Twice weekly BPP and UA dopplers to
 be done by Dr. [REDACTED]     2) Serial U/S every 3- 4
 weeks for fetal growth
              Tiger
